# Patient Record
Sex: Female | Born: 1935 | Race: Black or African American | Hispanic: No | Smoking: Never smoker
Health system: Southern US, Community
[De-identification: ages and names within clinical notes are randomized; demographics above are authoritative.]

## PROBLEM LIST (undated history)

## (undated) DIAGNOSIS — I712 Thoracic aortic aneurysm, without rupture, unspecified: Secondary | ICD-10-CM

## (undated) DIAGNOSIS — K259 Gastric ulcer, unspecified as acute or chronic, without hemorrhage or perforation: Secondary | ICD-10-CM

## (undated) DIAGNOSIS — Z86718 Personal history of other venous thrombosis and embolism: Secondary | ICD-10-CM

## (undated) DIAGNOSIS — E785 Hyperlipidemia, unspecified: Secondary | ICD-10-CM

## (undated) DIAGNOSIS — I10 Essential (primary) hypertension: Secondary | ICD-10-CM

## (undated) DIAGNOSIS — I2510 Atherosclerotic heart disease of native coronary artery without angina pectoris: Secondary | ICD-10-CM

## (undated) DIAGNOSIS — K589 Irritable bowel syndrome without diarrhea: Secondary | ICD-10-CM

## (undated) DIAGNOSIS — K5790 Diverticulosis of intestine, part unspecified, without perforation or abscess without bleeding: Secondary | ICD-10-CM

## (undated) DIAGNOSIS — K7460 Unspecified cirrhosis of liver: Secondary | ICD-10-CM

## (undated) HISTORY — DX: Thoracic aortic aneurysm, without rupture: I71.2

## (undated) HISTORY — DX: Atherosclerotic heart disease of native coronary artery without angina pectoris: I25.10

## (undated) HISTORY — DX: Unspecified cirrhosis of liver: K74.60

## (undated) HISTORY — DX: Diverticulosis of intestine, part unspecified, without perforation or abscess without bleeding: K57.90

## (undated) HISTORY — PX: OTHER SURGICAL HISTORY: SHX169

## (undated) HISTORY — DX: Personal history of other venous thrombosis and embolism: Z86.718

## (undated) HISTORY — DX: Irritable bowel syndrome, unspecified: K58.9

## (undated) HISTORY — DX: Thoracic aortic aneurysm, without rupture, unspecified: I71.20

## (undated) HISTORY — DX: Gastric ulcer, unspecified as acute or chronic, without hemorrhage or perforation: K25.9

## (undated) HISTORY — DX: Hyperlipidemia, unspecified: E78.5

## (undated) HISTORY — DX: Essential (primary) hypertension: I10

---

## 1985-03-21 HISTORY — PX: CHOLECYSTECTOMY: SHX55

## 2003-12-23 ENCOUNTER — Other Ambulatory Visit: Payer: Self-pay

## 2003-12-23 ENCOUNTER — Emergency Department: Payer: Self-pay | Admitting: Emergency Medicine

## 2003-12-30 ENCOUNTER — Emergency Department: Payer: Self-pay | Admitting: Emergency Medicine

## 2004-04-03 ENCOUNTER — Inpatient Hospital Stay: Payer: Self-pay | Admitting: Internal Medicine

## 2005-01-02 ENCOUNTER — Emergency Department: Payer: Self-pay | Admitting: Emergency Medicine

## 2005-01-08 ENCOUNTER — Other Ambulatory Visit: Payer: Self-pay

## 2005-01-08 ENCOUNTER — Emergency Department: Payer: Self-pay | Admitting: Emergency Medicine

## 2005-04-20 ENCOUNTER — Emergency Department: Payer: Self-pay | Admitting: Unknown Physician Specialty

## 2005-04-21 ENCOUNTER — Other Ambulatory Visit: Payer: Self-pay

## 2005-05-19 ENCOUNTER — Emergency Department: Payer: Self-pay | Admitting: General Practice

## 2005-05-19 ENCOUNTER — Ambulatory Visit: Payer: Self-pay | Admitting: Gastroenterology

## 2005-05-29 ENCOUNTER — Emergency Department: Payer: Self-pay | Admitting: General Practice

## 2005-05-29 ENCOUNTER — Other Ambulatory Visit: Payer: Self-pay

## 2005-10-15 ENCOUNTER — Emergency Department: Payer: Self-pay

## 2005-10-15 ENCOUNTER — Other Ambulatory Visit: Payer: Self-pay

## 2005-10-17 ENCOUNTER — Ambulatory Visit: Payer: Self-pay

## 2005-11-07 ENCOUNTER — Emergency Department: Payer: Self-pay | Admitting: Unknown Physician Specialty

## 2005-11-08 ENCOUNTER — Other Ambulatory Visit: Payer: Self-pay

## 2005-11-15 ENCOUNTER — Emergency Department: Payer: Self-pay | Admitting: Emergency Medicine

## 2006-04-02 ENCOUNTER — Other Ambulatory Visit: Payer: Self-pay

## 2006-04-02 ENCOUNTER — Emergency Department: Payer: Self-pay | Admitting: Internal Medicine

## 2006-04-28 ENCOUNTER — Ambulatory Visit: Payer: Self-pay | Admitting: Internal Medicine

## 2006-06-03 ENCOUNTER — Emergency Department: Payer: Self-pay | Admitting: Emergency Medicine

## 2006-06-03 ENCOUNTER — Other Ambulatory Visit: Payer: Self-pay

## 2006-06-05 ENCOUNTER — Other Ambulatory Visit: Payer: Self-pay

## 2006-06-05 ENCOUNTER — Emergency Department: Payer: Self-pay | Admitting: Emergency Medicine

## 2006-06-06 ENCOUNTER — Other Ambulatory Visit: Payer: Self-pay

## 2006-06-06 ENCOUNTER — Inpatient Hospital Stay: Payer: Self-pay | Admitting: Internal Medicine

## 2006-06-15 ENCOUNTER — Emergency Department: Payer: Self-pay | Admitting: Emergency Medicine

## 2006-06-17 ENCOUNTER — Other Ambulatory Visit: Payer: Self-pay

## 2006-06-17 ENCOUNTER — Emergency Department: Payer: Self-pay | Admitting: Emergency Medicine

## 2006-06-20 ENCOUNTER — Ambulatory Visit: Payer: Self-pay | Admitting: Gastroenterology

## 2006-06-22 ENCOUNTER — Emergency Department: Payer: Self-pay | Admitting: Internal Medicine

## 2006-07-03 ENCOUNTER — Emergency Department: Payer: Self-pay | Admitting: General Practice

## 2006-07-08 ENCOUNTER — Emergency Department: Payer: Self-pay | Admitting: Emergency Medicine

## 2006-07-08 ENCOUNTER — Other Ambulatory Visit: Payer: Self-pay

## 2006-07-11 ENCOUNTER — Emergency Department: Payer: Self-pay | Admitting: Emergency Medicine

## 2006-07-15 ENCOUNTER — Emergency Department: Payer: Self-pay | Admitting: Emergency Medicine

## 2006-07-27 ENCOUNTER — Emergency Department: Payer: Self-pay | Admitting: Emergency Medicine

## 2006-07-27 ENCOUNTER — Other Ambulatory Visit: Payer: Self-pay

## 2006-11-24 ENCOUNTER — Emergency Department: Payer: Self-pay | Admitting: Emergency Medicine

## 2006-11-24 ENCOUNTER — Other Ambulatory Visit: Payer: Self-pay

## 2006-12-03 ENCOUNTER — Emergency Department: Payer: Self-pay | Admitting: Emergency Medicine

## 2006-12-22 DIAGNOSIS — K5900 Constipation, unspecified: Secondary | ICD-10-CM | POA: Insufficient documentation

## 2006-12-22 DIAGNOSIS — I10 Essential (primary) hypertension: Secondary | ICD-10-CM | POA: Insufficient documentation

## 2007-06-06 ENCOUNTER — Emergency Department: Payer: Self-pay | Admitting: Unknown Physician Specialty

## 2007-06-08 ENCOUNTER — Other Ambulatory Visit: Payer: Self-pay

## 2007-06-08 ENCOUNTER — Emergency Department: Payer: Self-pay | Admitting: Emergency Medicine

## 2007-07-11 ENCOUNTER — Ambulatory Visit: Payer: Self-pay | Admitting: Family Medicine

## 2007-08-14 DIAGNOSIS — I2510 Atherosclerotic heart disease of native coronary artery without angina pectoris: Secondary | ICD-10-CM

## 2007-08-14 HISTORY — DX: Atherosclerotic heart disease of native coronary artery without angina pectoris: I25.10

## 2008-07-31 ENCOUNTER — Ambulatory Visit: Payer: Self-pay | Admitting: Family Medicine

## 2008-08-06 ENCOUNTER — Inpatient Hospital Stay: Payer: Self-pay | Admitting: Internal Medicine

## 2008-08-06 ENCOUNTER — Ambulatory Visit: Payer: Self-pay | Admitting: Family Medicine

## 2008-08-14 DIAGNOSIS — Z86711 Personal history of pulmonary embolism: Secondary | ICD-10-CM | POA: Insufficient documentation

## 2008-08-28 ENCOUNTER — Ambulatory Visit: Payer: Self-pay | Admitting: Family Medicine

## 2008-09-19 ENCOUNTER — Ambulatory Visit: Payer: Self-pay | Admitting: Family Medicine

## 2009-01-09 ENCOUNTER — Emergency Department: Payer: Self-pay | Admitting: Emergency Medicine

## 2009-02-09 ENCOUNTER — Emergency Department: Payer: Self-pay | Admitting: Emergency Medicine

## 2009-02-18 ENCOUNTER — Ambulatory Visit: Payer: Self-pay | Admitting: Oncology

## 2009-02-18 HISTORY — PX: UPPER GASTROINTESTINAL ENDOSCOPY: SHX188

## 2009-02-23 ENCOUNTER — Ambulatory Visit: Payer: Self-pay | Admitting: Family Medicine

## 2009-02-28 ENCOUNTER — Inpatient Hospital Stay: Payer: Self-pay | Admitting: Internal Medicine

## 2009-03-02 DIAGNOSIS — K257 Chronic gastric ulcer without hemorrhage or perforation: Secondary | ICD-10-CM | POA: Insufficient documentation

## 2009-03-06 DIAGNOSIS — K449 Diaphragmatic hernia without obstruction or gangrene: Secondary | ICD-10-CM | POA: Insufficient documentation

## 2009-03-20 ENCOUNTER — Emergency Department: Payer: Self-pay | Admitting: Emergency Medicine

## 2009-03-21 ENCOUNTER — Ambulatory Visit: Payer: Self-pay | Admitting: Oncology

## 2009-04-08 ENCOUNTER — Ambulatory Visit: Payer: Self-pay | Admitting: Oncology

## 2009-04-21 ENCOUNTER — Ambulatory Visit: Payer: Self-pay | Admitting: Oncology

## 2009-05-19 HISTORY — PX: US CAROTID DOPPLER BILATERAL (ARMC HX): HXRAD1402

## 2009-05-19 HISTORY — PX: ESOPHAGOGASTRODUODENOSCOPY: SHX1529

## 2009-06-03 ENCOUNTER — Ambulatory Visit: Payer: Self-pay | Admitting: Family Medicine

## 2009-08-04 ENCOUNTER — Ambulatory Visit: Payer: Self-pay | Admitting: Family Medicine

## 2010-07-19 ENCOUNTER — Ambulatory Visit: Payer: Self-pay | Admitting: Family Medicine

## 2010-07-19 ENCOUNTER — Emergency Department: Payer: Self-pay | Admitting: Emergency Medicine

## 2010-10-21 ENCOUNTER — Ambulatory Visit: Payer: Self-pay | Admitting: Family Medicine

## 2010-10-21 DIAGNOSIS — M545 Low back pain: Secondary | ICD-10-CM | POA: Insufficient documentation

## 2011-04-28 DIAGNOSIS — I2699 Other pulmonary embolism without acute cor pulmonale: Secondary | ICD-10-CM | POA: Diagnosis not present

## 2011-04-28 DIAGNOSIS — E7800 Pure hypercholesterolemia, unspecified: Secondary | ICD-10-CM | POA: Diagnosis not present

## 2011-04-28 DIAGNOSIS — I2510 Atherosclerotic heart disease of native coronary artery without angina pectoris: Secondary | ICD-10-CM | POA: Diagnosis not present

## 2011-04-28 DIAGNOSIS — I10 Essential (primary) hypertension: Secondary | ICD-10-CM | POA: Diagnosis not present

## 2011-05-02 DIAGNOSIS — E7800 Pure hypercholesterolemia, unspecified: Secondary | ICD-10-CM | POA: Diagnosis not present

## 2011-05-03 DIAGNOSIS — R0989 Other specified symptoms and signs involving the circulatory and respiratory systems: Secondary | ICD-10-CM | POA: Diagnosis not present

## 2011-05-03 DIAGNOSIS — R0609 Other forms of dyspnea: Secondary | ICD-10-CM | POA: Diagnosis not present

## 2011-05-03 DIAGNOSIS — I209 Angina pectoris, unspecified: Secondary | ICD-10-CM | POA: Diagnosis not present

## 2011-05-03 DIAGNOSIS — I2510 Atherosclerotic heart disease of native coronary artery without angina pectoris: Secondary | ICD-10-CM | POA: Diagnosis not present

## 2011-05-26 ENCOUNTER — Ambulatory Visit: Payer: Self-pay | Admitting: Internal Medicine

## 2011-05-26 DIAGNOSIS — I209 Angina pectoris, unspecified: Secondary | ICD-10-CM | POA: Diagnosis not present

## 2011-05-26 DIAGNOSIS — I2510 Atherosclerotic heart disease of native coronary artery without angina pectoris: Secondary | ICD-10-CM | POA: Diagnosis not present

## 2011-05-26 DIAGNOSIS — R0602 Shortness of breath: Secondary | ICD-10-CM | POA: Diagnosis not present

## 2011-10-21 DIAGNOSIS — I2510 Atherosclerotic heart disease of native coronary artery without angina pectoris: Secondary | ICD-10-CM | POA: Diagnosis not present

## 2011-10-21 DIAGNOSIS — E7800 Pure hypercholesterolemia, unspecified: Secondary | ICD-10-CM | POA: Diagnosis not present

## 2011-10-21 DIAGNOSIS — Z7901 Long term (current) use of anticoagulants: Secondary | ICD-10-CM | POA: Diagnosis not present

## 2011-10-21 DIAGNOSIS — I2699 Other pulmonary embolism without acute cor pulmonale: Secondary | ICD-10-CM | POA: Diagnosis not present

## 2011-10-28 DIAGNOSIS — E7800 Pure hypercholesterolemia, unspecified: Secondary | ICD-10-CM | POA: Diagnosis not present

## 2011-10-28 DIAGNOSIS — I2699 Other pulmonary embolism without acute cor pulmonale: Secondary | ICD-10-CM | POA: Diagnosis not present

## 2011-10-28 DIAGNOSIS — Z7901 Long term (current) use of anticoagulants: Secondary | ICD-10-CM | POA: Diagnosis not present

## 2011-10-28 DIAGNOSIS — I2510 Atherosclerotic heart disease of native coronary artery without angina pectoris: Secondary | ICD-10-CM | POA: Diagnosis not present

## 2011-11-04 DIAGNOSIS — R109 Unspecified abdominal pain: Secondary | ICD-10-CM | POA: Diagnosis not present

## 2011-11-04 DIAGNOSIS — Z7901 Long term (current) use of anticoagulants: Secondary | ICD-10-CM | POA: Diagnosis not present

## 2011-11-04 DIAGNOSIS — E7800 Pure hypercholesterolemia, unspecified: Secondary | ICD-10-CM | POA: Diagnosis not present

## 2011-11-04 DIAGNOSIS — Z0000 Encounter for general adult medical examination without abnormal findings: Secondary | ICD-10-CM | POA: Diagnosis not present

## 2011-11-10 DIAGNOSIS — I209 Angina pectoris, unspecified: Secondary | ICD-10-CM | POA: Diagnosis not present

## 2011-11-10 DIAGNOSIS — I2510 Atherosclerotic heart disease of native coronary artery without angina pectoris: Secondary | ICD-10-CM | POA: Diagnosis not present

## 2011-12-01 ENCOUNTER — Emergency Department: Payer: Self-pay | Admitting: Internal Medicine

## 2011-12-01 DIAGNOSIS — Z86718 Personal history of other venous thrombosis and embolism: Secondary | ICD-10-CM | POA: Diagnosis not present

## 2011-12-01 DIAGNOSIS — Z79899 Other long term (current) drug therapy: Secondary | ICD-10-CM | POA: Diagnosis not present

## 2011-12-01 DIAGNOSIS — R42 Dizziness and giddiness: Secondary | ICD-10-CM | POA: Diagnosis not present

## 2011-12-01 DIAGNOSIS — I252 Old myocardial infarction: Secondary | ICD-10-CM | POA: Diagnosis not present

## 2011-12-01 DIAGNOSIS — Z23 Encounter for immunization: Secondary | ICD-10-CM | POA: Diagnosis not present

## 2011-12-01 DIAGNOSIS — D689 Coagulation defect, unspecified: Secondary | ICD-10-CM | POA: Diagnosis not present

## 2011-12-01 DIAGNOSIS — I2510 Atherosclerotic heart disease of native coronary artery without angina pectoris: Secondary | ICD-10-CM | POA: Diagnosis not present

## 2011-12-01 DIAGNOSIS — Z7901 Long term (current) use of anticoagulants: Secondary | ICD-10-CM | POA: Diagnosis not present

## 2011-12-01 DIAGNOSIS — N289 Disorder of kidney and ureter, unspecified: Secondary | ICD-10-CM | POA: Diagnosis not present

## 2011-12-01 DIAGNOSIS — I10 Essential (primary) hypertension: Secondary | ICD-10-CM | POA: Diagnosis not present

## 2011-12-01 LAB — COMPREHENSIVE METABOLIC PANEL
BUN: 19 mg/dL — ABNORMAL HIGH (ref 7–18)
Bilirubin,Total: 1 mg/dL (ref 0.2–1.0)
Calcium, Total: 9.5 mg/dL (ref 8.5–10.1)
Chloride: 112 mmol/L — ABNORMAL HIGH (ref 98–107)
Co2: 19 mmol/L — ABNORMAL LOW (ref 21–32)
Creatinine: 1.05 mg/dL (ref 0.60–1.30)
EGFR (African American): 59 — ABNORMAL LOW
EGFR (Non-African Amer.): 51 — ABNORMAL LOW
Osmolality: 278 (ref 275–301)
Potassium: 4.1 mmol/L (ref 3.5–5.1)
SGOT(AST): 51 U/L — ABNORMAL HIGH (ref 15–37)
SGPT (ALT): 38 U/L (ref 12–78)
Sodium: 138 mmol/L (ref 136–145)

## 2011-12-01 LAB — CBC
HCT: 42.3 % (ref 35.0–47.0)
HGB: 14.3 g/dL (ref 12.0–16.0)
RDW: 14.6 % — ABNORMAL HIGH (ref 11.5–14.5)
WBC: 6.2 10*3/uL (ref 3.6–11.0)

## 2011-12-01 LAB — PROTIME-INR
INR: 4.4
Prothrombin Time: 42 secs — ABNORMAL HIGH (ref 11.5–14.7)

## 2011-12-02 DIAGNOSIS — Z0000 Encounter for general adult medical examination without abnormal findings: Secondary | ICD-10-CM | POA: Diagnosis not present

## 2011-12-02 DIAGNOSIS — Z86718 Personal history of other venous thrombosis and embolism: Secondary | ICD-10-CM | POA: Diagnosis not present

## 2011-12-02 DIAGNOSIS — I2699 Other pulmonary embolism without acute cor pulmonale: Secondary | ICD-10-CM | POA: Diagnosis not present

## 2011-12-02 DIAGNOSIS — K257 Chronic gastric ulcer without hemorrhage or perforation: Secondary | ICD-10-CM | POA: Diagnosis not present

## 2011-12-02 DIAGNOSIS — R1013 Epigastric pain: Secondary | ICD-10-CM | POA: Diagnosis not present

## 2011-12-05 DIAGNOSIS — R1013 Epigastric pain: Secondary | ICD-10-CM | POA: Diagnosis not present

## 2011-12-05 DIAGNOSIS — Z0000 Encounter for general adult medical examination without abnormal findings: Secondary | ICD-10-CM | POA: Diagnosis not present

## 2011-12-05 DIAGNOSIS — Z23 Encounter for immunization: Secondary | ICD-10-CM | POA: Diagnosis not present

## 2011-12-05 DIAGNOSIS — K589 Irritable bowel syndrome without diarrhea: Secondary | ICD-10-CM | POA: Diagnosis not present

## 2011-12-05 DIAGNOSIS — Z86718 Personal history of other venous thrombosis and embolism: Secondary | ICD-10-CM | POA: Diagnosis not present

## 2011-12-05 DIAGNOSIS — Z7901 Long term (current) use of anticoagulants: Secondary | ICD-10-CM | POA: Diagnosis not present

## 2012-01-05 DIAGNOSIS — Z86718 Personal history of other venous thrombosis and embolism: Secondary | ICD-10-CM | POA: Diagnosis not present

## 2012-01-05 DIAGNOSIS — Z23 Encounter for immunization: Secondary | ICD-10-CM | POA: Diagnosis not present

## 2012-01-05 DIAGNOSIS — R42 Dizziness and giddiness: Secondary | ICD-10-CM | POA: Diagnosis not present

## 2012-01-05 DIAGNOSIS — I10 Essential (primary) hypertension: Secondary | ICD-10-CM | POA: Diagnosis not present

## 2012-01-19 DIAGNOSIS — R42 Dizziness and giddiness: Secondary | ICD-10-CM | POA: Diagnosis not present

## 2012-01-19 DIAGNOSIS — Z23 Encounter for immunization: Secondary | ICD-10-CM | POA: Diagnosis not present

## 2012-01-19 DIAGNOSIS — Z86718 Personal history of other venous thrombosis and embolism: Secondary | ICD-10-CM | POA: Diagnosis not present

## 2012-01-19 DIAGNOSIS — I10 Essential (primary) hypertension: Secondary | ICD-10-CM | POA: Diagnosis not present

## 2012-03-15 DIAGNOSIS — H4010X0 Unspecified open-angle glaucoma, stage unspecified: Secondary | ICD-10-CM | POA: Diagnosis not present

## 2012-04-27 DIAGNOSIS — M129 Arthropathy, unspecified: Secondary | ICD-10-CM | POA: Diagnosis not present

## 2012-04-27 DIAGNOSIS — I10 Essential (primary) hypertension: Secondary | ICD-10-CM | POA: Diagnosis not present

## 2012-04-27 DIAGNOSIS — E7800 Pure hypercholesterolemia, unspecified: Secondary | ICD-10-CM | POA: Diagnosis not present

## 2012-04-27 DIAGNOSIS — I2510 Atherosclerotic heart disease of native coronary artery without angina pectoris: Secondary | ICD-10-CM | POA: Diagnosis not present

## 2012-05-14 DIAGNOSIS — I2510 Atherosclerotic heart disease of native coronary artery without angina pectoris: Secondary | ICD-10-CM | POA: Diagnosis not present

## 2012-05-14 DIAGNOSIS — I10 Essential (primary) hypertension: Secondary | ICD-10-CM | POA: Diagnosis not present

## 2012-07-21 ENCOUNTER — Emergency Department: Payer: Self-pay | Admitting: Emergency Medicine

## 2012-07-21 DIAGNOSIS — I10 Essential (primary) hypertension: Secondary | ICD-10-CM | POA: Diagnosis not present

## 2012-07-21 DIAGNOSIS — R6889 Other general symptoms and signs: Secondary | ICD-10-CM | POA: Diagnosis not present

## 2012-07-21 DIAGNOSIS — E119 Type 2 diabetes mellitus without complications: Secondary | ICD-10-CM | POA: Diagnosis not present

## 2012-07-21 DIAGNOSIS — R945 Abnormal results of liver function studies: Secondary | ICD-10-CM | POA: Diagnosis not present

## 2012-07-21 DIAGNOSIS — R1110 Vomiting, unspecified: Secondary | ICD-10-CM | POA: Diagnosis not present

## 2012-07-21 DIAGNOSIS — Z9089 Acquired absence of other organs: Secondary | ICD-10-CM | POA: Diagnosis not present

## 2012-07-21 DIAGNOSIS — R9431 Abnormal electrocardiogram [ECG] [EKG]: Secondary | ICD-10-CM | POA: Diagnosis not present

## 2012-07-21 DIAGNOSIS — R112 Nausea with vomiting, unspecified: Secondary | ICD-10-CM | POA: Diagnosis not present

## 2012-07-21 DIAGNOSIS — I252 Old myocardial infarction: Secondary | ICD-10-CM | POA: Diagnosis not present

## 2012-07-21 DIAGNOSIS — N390 Urinary tract infection, site not specified: Secondary | ICD-10-CM | POA: Diagnosis not present

## 2012-07-21 DIAGNOSIS — R197 Diarrhea, unspecified: Secondary | ICD-10-CM | POA: Diagnosis not present

## 2012-07-21 LAB — CBC
HCT: 45.5 % (ref 35.0–47.0)
MCH: 32.2 pg (ref 26.0–34.0)
MCV: 95 fL (ref 80–100)
Platelet: 149 10*3/uL — ABNORMAL LOW (ref 150–440)
RBC: 4.8 10*6/uL (ref 3.80–5.20)
RDW: 14.1 % (ref 11.5–14.5)

## 2012-07-21 LAB — COMPREHENSIVE METABOLIC PANEL
Albumin: 3.3 g/dL — ABNORMAL LOW (ref 3.4–5.0)
BUN: 17 mg/dL (ref 7–18)
Co2: 21 mmol/L (ref 21–32)
EGFR (African American): >60
EGFR (Non-African Amer.): 57 — ABNORMAL LOW
Glucose: 111 mg/dL — ABNORMAL HIGH (ref 65–99)
SGOT(AST): 142 U/L — ABNORMAL HIGH (ref 15–37)

## 2012-07-22 LAB — URINALYSIS, COMPLETE
Bilirubin,UR: NEGATIVE
Glucose,UR: NEGATIVE mg/dL (ref 0–75)
Nitrite: NEGATIVE
Ph: 5 (ref 4.5–8.0)
RBC,UR: 10 /HPF (ref 0–5)
Squamous Epithelial: 14
WBC UR: 43 /HPF (ref 0–5)

## 2012-10-09 DIAGNOSIS — H4010X0 Unspecified open-angle glaucoma, stage unspecified: Secondary | ICD-10-CM | POA: Diagnosis not present

## 2012-11-23 DIAGNOSIS — E7800 Pure hypercholesterolemia, unspecified: Secondary | ICD-10-CM | POA: Diagnosis not present

## 2012-11-23 DIAGNOSIS — M25519 Pain in unspecified shoulder: Secondary | ICD-10-CM | POA: Diagnosis not present

## 2012-11-23 DIAGNOSIS — I10 Essential (primary) hypertension: Secondary | ICD-10-CM | POA: Diagnosis not present

## 2012-11-23 DIAGNOSIS — I2510 Atherosclerotic heart disease of native coronary artery without angina pectoris: Secondary | ICD-10-CM | POA: Diagnosis not present

## 2012-11-23 DIAGNOSIS — M549 Dorsalgia, unspecified: Secondary | ICD-10-CM | POA: Diagnosis not present

## 2012-11-23 DIAGNOSIS — Z23 Encounter for immunization: Secondary | ICD-10-CM | POA: Diagnosis not present

## 2012-11-23 LAB — LIPID PANEL
CHOLESTEROL: 120 mg/dL (ref 0–200)
HDL: 48 mg/dL (ref 35–70)
LDL CALC: 59 mg/dL
TRIGLYCERIDES: 63 mg/dL (ref 40–160)

## 2012-11-23 LAB — BASIC METABOLIC PANEL
BUN: 12 mg/dL (ref 4–21)
CREATININE: 1 mg/dL (ref 0.5–1.1)
Glucose: 88 mg/dL
Potassium: 4.3 mmol/L (ref 3.4–5.3)
Sodium: 140 mmol/L (ref 137–147)

## 2012-11-26 ENCOUNTER — Ambulatory Visit: Payer: Self-pay | Admitting: Family Medicine

## 2012-11-26 DIAGNOSIS — M47814 Spondylosis without myelopathy or radiculopathy, thoracic region: Secondary | ICD-10-CM | POA: Diagnosis not present

## 2012-11-26 DIAGNOSIS — M25519 Pain in unspecified shoulder: Secondary | ICD-10-CM | POA: Diagnosis not present

## 2012-11-26 DIAGNOSIS — IMO0002 Reserved for concepts with insufficient information to code with codable children: Secondary | ICD-10-CM | POA: Diagnosis not present

## 2012-11-26 DIAGNOSIS — M549 Dorsalgia, unspecified: Secondary | ICD-10-CM | POA: Diagnosis not present

## 2012-12-04 DIAGNOSIS — R011 Cardiac murmur, unspecified: Secondary | ICD-10-CM | POA: Diagnosis not present

## 2012-12-04 DIAGNOSIS — I209 Angina pectoris, unspecified: Secondary | ICD-10-CM | POA: Diagnosis not present

## 2012-12-04 DIAGNOSIS — I2510 Atherosclerotic heart disease of native coronary artery without angina pectoris: Secondary | ICD-10-CM | POA: Diagnosis not present

## 2012-12-04 DIAGNOSIS — I10 Essential (primary) hypertension: Secondary | ICD-10-CM | POA: Diagnosis not present

## 2012-12-19 DIAGNOSIS — I10 Essential (primary) hypertension: Secondary | ICD-10-CM | POA: Diagnosis not present

## 2012-12-19 DIAGNOSIS — I209 Angina pectoris, unspecified: Secondary | ICD-10-CM | POA: Diagnosis not present

## 2012-12-19 DIAGNOSIS — I2510 Atherosclerotic heart disease of native coronary artery without angina pectoris: Secondary | ICD-10-CM | POA: Diagnosis not present

## 2012-12-19 DIAGNOSIS — I252 Old myocardial infarction: Secondary | ICD-10-CM | POA: Diagnosis not present

## 2013-05-23 DIAGNOSIS — H4010X0 Unspecified open-angle glaucoma, stage unspecified: Secondary | ICD-10-CM | POA: Diagnosis not present

## 2013-06-25 DIAGNOSIS — I209 Angina pectoris, unspecified: Secondary | ICD-10-CM | POA: Diagnosis not present

## 2013-06-25 DIAGNOSIS — I10 Essential (primary) hypertension: Secondary | ICD-10-CM | POA: Diagnosis not present

## 2013-06-25 DIAGNOSIS — I2510 Atherosclerotic heart disease of native coronary artery without angina pectoris: Secondary | ICD-10-CM | POA: Diagnosis not present

## 2013-07-08 DIAGNOSIS — K5900 Constipation, unspecified: Secondary | ICD-10-CM | POA: Diagnosis not present

## 2013-07-08 DIAGNOSIS — R1084 Generalized abdominal pain: Secondary | ICD-10-CM | POA: Diagnosis not present

## 2013-07-16 ENCOUNTER — Ambulatory Visit: Payer: Self-pay | Admitting: Gastroenterology

## 2013-07-16 DIAGNOSIS — K7460 Unspecified cirrhosis of liver: Secondary | ICD-10-CM | POA: Diagnosis not present

## 2013-07-16 DIAGNOSIS — I712 Thoracic aortic aneurysm, without rupture, unspecified: Secondary | ICD-10-CM | POA: Diagnosis not present

## 2013-07-16 DIAGNOSIS — K5900 Constipation, unspecified: Secondary | ICD-10-CM | POA: Diagnosis not present

## 2013-07-16 DIAGNOSIS — R1084 Generalized abdominal pain: Secondary | ICD-10-CM | POA: Diagnosis not present

## 2013-07-19 DIAGNOSIS — I10 Essential (primary) hypertension: Secondary | ICD-10-CM | POA: Diagnosis not present

## 2013-07-19 DIAGNOSIS — I2510 Atherosclerotic heart disease of native coronary artery without angina pectoris: Secondary | ICD-10-CM | POA: Diagnosis not present

## 2013-07-19 DIAGNOSIS — I712 Thoracic aortic aneurysm, without rupture, unspecified: Secondary | ICD-10-CM | POA: Diagnosis not present

## 2013-07-19 DIAGNOSIS — E785 Hyperlipidemia, unspecified: Secondary | ICD-10-CM | POA: Diagnosis not present

## 2013-07-22 DIAGNOSIS — I868 Varicose veins of other specified sites: Secondary | ICD-10-CM | POA: Diagnosis not present

## 2013-07-22 DIAGNOSIS — I712 Thoracic aortic aneurysm, without rupture, unspecified: Secondary | ICD-10-CM | POA: Diagnosis not present

## 2013-07-22 DIAGNOSIS — R918 Other nonspecific abnormal finding of lung field: Secondary | ICD-10-CM | POA: Diagnosis not present

## 2013-07-22 DIAGNOSIS — I2510 Atherosclerotic heart disease of native coronary artery without angina pectoris: Secondary | ICD-10-CM | POA: Diagnosis not present

## 2013-07-22 DIAGNOSIS — E079 Disorder of thyroid, unspecified: Secondary | ICD-10-CM | POA: Diagnosis not present

## 2013-07-22 DIAGNOSIS — N289 Disorder of kidney and ureter, unspecified: Secondary | ICD-10-CM | POA: Diagnosis not present

## 2013-07-22 DIAGNOSIS — N281 Cyst of kidney, acquired: Secondary | ICD-10-CM | POA: Diagnosis not present

## 2013-07-22 DIAGNOSIS — K7689 Other specified diseases of liver: Secondary | ICD-10-CM | POA: Diagnosis not present

## 2013-07-29 ENCOUNTER — Ambulatory Visit: Payer: Self-pay | Admitting: Gastroenterology

## 2013-07-29 DIAGNOSIS — I712 Thoracic aortic aneurysm, without rupture, unspecified: Secondary | ICD-10-CM | POA: Diagnosis not present

## 2013-07-29 DIAGNOSIS — N289 Disorder of kidney and ureter, unspecified: Secondary | ICD-10-CM | POA: Diagnosis not present

## 2013-07-29 DIAGNOSIS — K869 Disease of pancreas, unspecified: Secondary | ICD-10-CM | POA: Diagnosis not present

## 2013-07-29 DIAGNOSIS — K7460 Unspecified cirrhosis of liver: Secondary | ICD-10-CM | POA: Diagnosis not present

## 2013-08-01 ENCOUNTER — Ambulatory Visit: Payer: Self-pay | Admitting: Oncology

## 2013-08-05 DIAGNOSIS — R339 Retention of urine, unspecified: Secondary | ICD-10-CM | POA: Diagnosis not present

## 2013-08-05 DIAGNOSIS — D4120 Neoplasm of uncertain behavior of unspecified ureter: Secondary | ICD-10-CM | POA: Diagnosis not present

## 2013-08-05 DIAGNOSIS — D4100 Neoplasm of uncertain behavior of unspecified kidney: Secondary | ICD-10-CM | POA: Diagnosis not present

## 2013-08-05 DIAGNOSIS — N281 Cyst of kidney, acquired: Secondary | ICD-10-CM | POA: Diagnosis not present

## 2013-08-06 ENCOUNTER — Ambulatory Visit: Payer: Self-pay | Admitting: Oncology

## 2013-08-06 DIAGNOSIS — N289 Disorder of kidney and ureter, unspecified: Secondary | ICD-10-CM | POA: Diagnosis not present

## 2013-08-06 DIAGNOSIS — I712 Thoracic aortic aneurysm, without rupture, unspecified: Secondary | ICD-10-CM | POA: Diagnosis not present

## 2013-08-06 DIAGNOSIS — I10 Essential (primary) hypertension: Secondary | ICD-10-CM | POA: Diagnosis not present

## 2013-08-06 DIAGNOSIS — K7460 Unspecified cirrhosis of liver: Secondary | ICD-10-CM | POA: Diagnosis not present

## 2013-08-06 DIAGNOSIS — R109 Unspecified abdominal pain: Secondary | ICD-10-CM | POA: Diagnosis not present

## 2013-08-06 DIAGNOSIS — R978 Other abnormal tumor markers: Secondary | ICD-10-CM | POA: Diagnosis not present

## 2013-08-06 DIAGNOSIS — K869 Disease of pancreas, unspecified: Secondary | ICD-10-CM | POA: Diagnosis not present

## 2013-08-06 DIAGNOSIS — E7800 Pure hypercholesterolemia, unspecified: Secondary | ICD-10-CM | POA: Diagnosis not present

## 2013-08-06 LAB — COMPREHENSIVE METABOLIC PANEL
ALT: 21 U/L (ref 12–78)
Albumin: 3.1 g/dL — ABNORMAL LOW (ref 3.4–5.0)
Alkaline Phosphatase: 118 U/L — ABNORMAL HIGH
Anion Gap: 9 (ref 7–16)
BUN: 15 mg/dL (ref 7–18)
Bilirubin,Total: 1.5 mg/dL — ABNORMAL HIGH (ref 0.2–1.0)
Calcium, Total: 9 mg/dL (ref 8.5–10.1)
Chloride: 109 mmol/L — ABNORMAL HIGH (ref 98–107)
Co2: 23 mmol/L (ref 21–32)
Creatinine: 1.06 mg/dL (ref 0.60–1.30)
EGFR (African American): 58 — ABNORMAL LOW
GFR CALC NON AF AMER: 50 — AB
Glucose: 111 mg/dL — ABNORMAL HIGH (ref 65–99)
OSMOLALITY: 283 (ref 275–301)
POTASSIUM: 3.8 mmol/L (ref 3.5–5.1)
SGOT(AST): 36 U/L (ref 15–37)
SODIUM: 141 mmol/L (ref 136–145)
TOTAL PROTEIN: 7.6 g/dL (ref 6.4–8.2)

## 2013-08-06 LAB — CBC CANCER CENTER
BASOS ABS: 0.1 x10 3/mm (ref 0.0–0.1)
Basophil %: 1 %
Eosinophil #: 0.1 x10 3/mm (ref 0.0–0.7)
Eosinophil %: 2.2 %
HCT: 40.7 % (ref 35.0–47.0)
HGB: 13.6 g/dL (ref 12.0–16.0)
LYMPHS ABS: 1.8 x10 3/mm (ref 1.0–3.6)
Lymphocyte %: 30.7 %
MCH: 32.2 pg (ref 26.0–34.0)
MCHC: 33.3 g/dL (ref 32.0–36.0)
MCV: 97 fL (ref 80–100)
MONO ABS: 0.6 x10 3/mm (ref 0.2–0.9)
MONOS PCT: 10.7 %
Neutrophil #: 3.3 x10 3/mm (ref 1.4–6.5)
Neutrophil %: 55.4 %
Platelet: 110 x10 3/mm — ABNORMAL LOW (ref 150–440)
RBC: 4.21 10*6/uL (ref 3.80–5.20)
RDW: 13.7 % (ref 11.5–14.5)
WBC: 5.9 x10 3/mm (ref 3.6–11.0)

## 2013-08-07 ENCOUNTER — Ambulatory Visit: Payer: Self-pay | Admitting: Vascular Surgery

## 2013-08-07 DIAGNOSIS — R339 Retention of urine, unspecified: Secondary | ICD-10-CM | POA: Insufficient documentation

## 2013-08-07 DIAGNOSIS — I2510 Atherosclerotic heart disease of native coronary artery without angina pectoris: Secondary | ICD-10-CM | POA: Diagnosis not present

## 2013-08-07 DIAGNOSIS — I10 Essential (primary) hypertension: Secondary | ICD-10-CM | POA: Diagnosis not present

## 2013-08-07 DIAGNOSIS — E785 Hyperlipidemia, unspecified: Secondary | ICD-10-CM | POA: Diagnosis not present

## 2013-08-07 DIAGNOSIS — Z01812 Encounter for preprocedural laboratory examination: Secondary | ICD-10-CM | POA: Diagnosis not present

## 2013-08-07 DIAGNOSIS — N281 Cyst of kidney, acquired: Secondary | ICD-10-CM | POA: Insufficient documentation

## 2013-08-07 DIAGNOSIS — Z01810 Encounter for preprocedural cardiovascular examination: Secondary | ICD-10-CM | POA: Diagnosis not present

## 2013-08-07 DIAGNOSIS — D4120 Neoplasm of uncertain behavior of unspecified ureter: Secondary | ICD-10-CM | POA: Insufficient documentation

## 2013-08-07 DIAGNOSIS — I712 Thoracic aortic aneurysm, without rupture, unspecified: Secondary | ICD-10-CM | POA: Diagnosis not present

## 2013-08-07 LAB — URINALYSIS, COMPLETE
BILIRUBIN, UR: NEGATIVE
Glucose,UR: NEGATIVE mg/dL (ref 0–75)
Hyaline Cast: 2
KETONE: NEGATIVE
NITRITE: NEGATIVE
Ph: 5 (ref 4.5–8.0)
Protein: NEGATIVE
RBC,UR: 24 /HPF (ref 0–5)
Specific Gravity: 1.019 (ref 1.003–1.030)
Squamous Epithelial: 9
WBC UR: 5 /HPF (ref 0–5)

## 2013-08-07 LAB — CANCER ANTIGEN 19-9: CA 19 9: 81 U/mL — AB (ref 0–35)

## 2013-08-08 DIAGNOSIS — Z86718 Personal history of other venous thrombosis and embolism: Secondary | ICD-10-CM | POA: Diagnosis not present

## 2013-08-08 DIAGNOSIS — Z86711 Personal history of pulmonary embolism: Secondary | ICD-10-CM | POA: Diagnosis not present

## 2013-08-08 DIAGNOSIS — I7100 Dissection of unspecified site of aorta: Secondary | ICD-10-CM | POA: Diagnosis not present

## 2013-08-08 DIAGNOSIS — I10 Essential (primary) hypertension: Secondary | ICD-10-CM | POA: Diagnosis not present

## 2013-08-13 DIAGNOSIS — I10 Essential (primary) hypertension: Secondary | ICD-10-CM | POA: Diagnosis not present

## 2013-08-13 DIAGNOSIS — Z86718 Personal history of other venous thrombosis and embolism: Secondary | ICD-10-CM | POA: Diagnosis not present

## 2013-08-13 DIAGNOSIS — I7100 Dissection of unspecified site of aorta: Secondary | ICD-10-CM | POA: Diagnosis not present

## 2013-08-13 DIAGNOSIS — Z86711 Personal history of pulmonary embolism: Secondary | ICD-10-CM | POA: Diagnosis not present

## 2013-08-14 ENCOUNTER — Inpatient Hospital Stay: Payer: Self-pay | Admitting: Vascular Surgery

## 2013-08-14 DIAGNOSIS — I712 Thoracic aortic aneurysm, without rupture, unspecified: Secondary | ICD-10-CM | POA: Diagnosis not present

## 2013-08-14 DIAGNOSIS — I10 Essential (primary) hypertension: Secondary | ICD-10-CM | POA: Diagnosis not present

## 2013-08-14 DIAGNOSIS — N289 Disorder of kidney and ureter, unspecified: Secondary | ICD-10-CM | POA: Diagnosis present

## 2013-08-14 DIAGNOSIS — E785 Hyperlipidemia, unspecified: Secondary | ICD-10-CM | POA: Diagnosis present

## 2013-08-14 DIAGNOSIS — I711 Thoracic aortic aneurysm, ruptured, unspecified: Secondary | ICD-10-CM | POA: Diagnosis not present

## 2013-08-14 DIAGNOSIS — I2510 Atherosclerotic heart disease of native coronary artery without angina pectoris: Secondary | ICD-10-CM | POA: Diagnosis present

## 2013-08-15 LAB — COMPREHENSIVE METABOLIC PANEL
AST: 41 U/L — AB (ref 15–37)
Albumin: 2.6 g/dL — ABNORMAL LOW (ref 3.4–5.0)
Alkaline Phosphatase: 100 U/L
Anion Gap: 5 — ABNORMAL LOW (ref 7–16)
BUN: 14 mg/dL (ref 7–18)
Bilirubin,Total: 0.9 mg/dL (ref 0.2–1.0)
CREATININE: 1.09 mg/dL (ref 0.60–1.30)
Calcium, Total: 8.3 mg/dL — ABNORMAL LOW (ref 8.5–10.1)
Chloride: 114 mmol/L — ABNORMAL HIGH (ref 98–107)
Co2: 22 mmol/L (ref 21–32)
EGFR (African American): 56 — ABNORMAL LOW
GFR CALC NON AF AMER: 48 — AB
Glucose: 134 mg/dL — ABNORMAL HIGH (ref 65–99)
Osmolality: 284 (ref 275–301)
Potassium: 3.9 mmol/L (ref 3.5–5.1)
SGPT (ALT): 25 U/L (ref 12–78)
Sodium: 141 mmol/L (ref 136–145)
Total Protein: 6.7 g/dL (ref 6.4–8.2)

## 2013-08-15 LAB — CBC WITH DIFFERENTIAL/PLATELET
BASOS PCT: 0.3 %
Basophil #: 0 10*3/uL (ref 0.0–0.1)
EOS ABS: 0 10*3/uL (ref 0.0–0.7)
EOS PCT: 0 %
HCT: 38.7 % (ref 35.0–47.0)
HGB: 12.6 g/dL (ref 12.0–16.0)
LYMPHS PCT: 8.2 %
Lymphocyte #: 0.8 10*3/uL — ABNORMAL LOW (ref 1.0–3.6)
MCH: 32 pg (ref 26.0–34.0)
MCHC: 32.7 g/dL (ref 32.0–36.0)
MCV: 98 fL (ref 80–100)
MONO ABS: 0.6 x10 3/mm (ref 0.2–0.9)
Monocyte %: 6.1 %
NEUTROS ABS: 8.1 10*3/uL — AB (ref 1.4–6.5)
Neutrophil %: 85.4 %
Platelet: 66 10*3/uL — ABNORMAL LOW (ref 150–440)
RBC: 3.94 10*6/uL (ref 3.80–5.20)
RDW: 13.5 % (ref 11.5–14.5)
WBC: 9.5 10*3/uL (ref 3.6–11.0)

## 2013-08-15 LAB — PROTIME-INR
INR: 1.4
Prothrombin Time: 17.2 secs — ABNORMAL HIGH (ref 11.5–14.7)

## 2013-08-15 LAB — APTT: Activated PTT: 35.1 secs (ref 23.6–35.9)

## 2013-08-15 LAB — MAGNESIUM: MAGNESIUM: 1.6 mg/dL — AB

## 2013-08-15 LAB — PHOSPHORUS: PHOSPHORUS: 3 mg/dL (ref 2.5–4.9)

## 2013-08-19 ENCOUNTER — Ambulatory Visit: Payer: Self-pay | Admitting: Oncology

## 2013-09-06 DIAGNOSIS — I10 Essential (primary) hypertension: Secondary | ICD-10-CM | POA: Diagnosis not present

## 2013-09-06 DIAGNOSIS — R109 Unspecified abdominal pain: Secondary | ICD-10-CM | POA: Diagnosis not present

## 2013-09-06 DIAGNOSIS — I712 Thoracic aortic aneurysm, without rupture, unspecified: Secondary | ICD-10-CM | POA: Diagnosis not present

## 2013-09-16 ENCOUNTER — Ambulatory Visit: Payer: Self-pay | Admitting: Family Medicine

## 2013-09-16 DIAGNOSIS — I7100 Dissection of unspecified site of aorta: Secondary | ICD-10-CM | POA: Diagnosis not present

## 2013-09-16 DIAGNOSIS — K589 Irritable bowel syndrome without diarrhea: Secondary | ICD-10-CM | POA: Diagnosis not present

## 2013-09-16 DIAGNOSIS — K5900 Constipation, unspecified: Secondary | ICD-10-CM | POA: Diagnosis not present

## 2013-09-16 DIAGNOSIS — R109 Unspecified abdominal pain: Secondary | ICD-10-CM | POA: Diagnosis not present

## 2013-09-16 DIAGNOSIS — R1013 Epigastric pain: Secondary | ICD-10-CM | POA: Diagnosis not present

## 2013-09-24 ENCOUNTER — Ambulatory Visit: Payer: Self-pay | Admitting: Oncology

## 2013-10-10 ENCOUNTER — Ambulatory Visit: Payer: Self-pay | Admitting: Gastroenterology

## 2013-10-10 DIAGNOSIS — K869 Disease of pancreas, unspecified: Secondary | ICD-10-CM | POA: Diagnosis not present

## 2013-10-10 DIAGNOSIS — I10 Essential (primary) hypertension: Secondary | ICD-10-CM | POA: Diagnosis not present

## 2013-10-10 DIAGNOSIS — I712 Thoracic aortic aneurysm, without rupture, unspecified: Secondary | ICD-10-CM | POA: Diagnosis not present

## 2013-10-10 DIAGNOSIS — Z79899 Other long term (current) drug therapy: Secondary | ICD-10-CM | POA: Diagnosis not present

## 2013-10-10 DIAGNOSIS — K862 Cyst of pancreas: Secondary | ICD-10-CM | POA: Diagnosis not present

## 2013-10-10 DIAGNOSIS — R109 Unspecified abdominal pain: Secondary | ICD-10-CM | POA: Diagnosis not present

## 2013-10-10 DIAGNOSIS — I252 Old myocardial infarction: Secondary | ICD-10-CM | POA: Diagnosis not present

## 2013-10-10 DIAGNOSIS — M1990 Unspecified osteoarthritis, unspecified site: Secondary | ICD-10-CM | POA: Diagnosis not present

## 2013-10-10 DIAGNOSIS — N289 Disorder of kidney and ureter, unspecified: Secondary | ICD-10-CM | POA: Diagnosis not present

## 2013-10-10 DIAGNOSIS — R1013 Epigastric pain: Secondary | ICD-10-CM | POA: Diagnosis not present

## 2013-10-10 DIAGNOSIS — H409 Unspecified glaucoma: Secondary | ICD-10-CM | POA: Diagnosis not present

## 2013-10-16 DIAGNOSIS — K863 Pseudocyst of pancreas: Secondary | ICD-10-CM | POA: Diagnosis not present

## 2013-10-16 DIAGNOSIS — K862 Cyst of pancreas: Secondary | ICD-10-CM | POA: Diagnosis not present

## 2013-10-19 ENCOUNTER — Ambulatory Visit: Payer: Self-pay | Admitting: Oncology

## 2013-10-24 LAB — PATHOLOGY REPORT

## 2013-11-06 DIAGNOSIS — R634 Abnormal weight loss: Secondary | ICD-10-CM | POA: Diagnosis not present

## 2013-11-06 DIAGNOSIS — R748 Abnormal levels of other serum enzymes: Secondary | ICD-10-CM | POA: Diagnosis not present

## 2013-11-06 DIAGNOSIS — R933 Abnormal findings on diagnostic imaging of other parts of digestive tract: Secondary | ICD-10-CM | POA: Diagnosis not present

## 2013-11-06 DIAGNOSIS — R109 Unspecified abdominal pain: Secondary | ICD-10-CM | POA: Diagnosis not present

## 2013-11-07 ENCOUNTER — Ambulatory Visit: Payer: Self-pay | Admitting: Gastroenterology

## 2013-11-07 DIAGNOSIS — R634 Abnormal weight loss: Secondary | ICD-10-CM | POA: Diagnosis not present

## 2013-11-07 DIAGNOSIS — N281 Cyst of kidney, acquired: Secondary | ICD-10-CM | POA: Diagnosis not present

## 2013-11-07 DIAGNOSIS — K7460 Unspecified cirrhosis of liver: Secondary | ICD-10-CM | POA: Diagnosis not present

## 2013-11-07 DIAGNOSIS — R933 Abnormal findings on diagnostic imaging of other parts of digestive tract: Secondary | ICD-10-CM | POA: Diagnosis not present

## 2013-11-07 DIAGNOSIS — R109 Unspecified abdominal pain: Secondary | ICD-10-CM | POA: Diagnosis not present

## 2013-11-13 DIAGNOSIS — K7460 Unspecified cirrhosis of liver: Secondary | ICD-10-CM | POA: Diagnosis not present

## 2013-11-13 DIAGNOSIS — R634 Abnormal weight loss: Secondary | ICD-10-CM | POA: Diagnosis not present

## 2013-11-13 DIAGNOSIS — R748 Abnormal levels of other serum enzymes: Secondary | ICD-10-CM | POA: Diagnosis not present

## 2013-11-13 DIAGNOSIS — R4182 Altered mental status, unspecified: Secondary | ICD-10-CM | POA: Diagnosis not present

## 2013-11-13 DIAGNOSIS — R1084 Generalized abdominal pain: Secondary | ICD-10-CM | POA: Diagnosis not present

## 2013-11-13 DIAGNOSIS — R933 Abnormal findings on diagnostic imaging of other parts of digestive tract: Secondary | ICD-10-CM | POA: Diagnosis not present

## 2013-12-04 ENCOUNTER — Emergency Department: Payer: Self-pay | Admitting: Emergency Medicine

## 2013-12-04 DIAGNOSIS — Z7901 Long term (current) use of anticoagulants: Secondary | ICD-10-CM | POA: Diagnosis not present

## 2013-12-04 DIAGNOSIS — R52 Pain, unspecified: Secondary | ICD-10-CM | POA: Diagnosis not present

## 2013-12-04 DIAGNOSIS — K5641 Fecal impaction: Secondary | ICD-10-CM | POA: Diagnosis not present

## 2013-12-04 DIAGNOSIS — I82509 Chronic embolism and thrombosis of unspecified deep veins of unspecified lower extremity: Secondary | ICD-10-CM | POA: Diagnosis not present

## 2013-12-04 DIAGNOSIS — K5649 Other impaction of intestine: Secondary | ICD-10-CM | POA: Diagnosis not present

## 2013-12-04 DIAGNOSIS — I10 Essential (primary) hypertension: Secondary | ICD-10-CM | POA: Diagnosis not present

## 2013-12-04 DIAGNOSIS — I825Y9 Chronic embolism and thrombosis of unspecified deep veins of unspecified proximal lower extremity: Secondary | ICD-10-CM | POA: Diagnosis not present

## 2013-12-04 DIAGNOSIS — K7460 Unspecified cirrhosis of liver: Secondary | ICD-10-CM | POA: Diagnosis not present

## 2013-12-04 DIAGNOSIS — I7102 Dissection of abdominal aorta: Secondary | ICD-10-CM | POA: Diagnosis not present

## 2013-12-04 DIAGNOSIS — Z79899 Other long term (current) drug therapy: Secondary | ICD-10-CM | POA: Diagnosis not present

## 2013-12-04 DIAGNOSIS — R109 Unspecified abdominal pain: Secondary | ICD-10-CM | POA: Diagnosis not present

## 2013-12-04 DIAGNOSIS — N281 Cyst of kidney, acquired: Secondary | ICD-10-CM | POA: Diagnosis not present

## 2013-12-04 DIAGNOSIS — Z792 Long term (current) use of antibiotics: Secondary | ICD-10-CM | POA: Diagnosis not present

## 2013-12-04 DIAGNOSIS — R188 Other ascites: Secondary | ICD-10-CM | POA: Diagnosis not present

## 2013-12-04 LAB — URINALYSIS, COMPLETE
Bilirubin,UR: NEGATIVE
Glucose,UR: NEGATIVE mg/dL (ref 0–75)
Ketone: NEGATIVE
Leukocyte Esterase: NEGATIVE
NITRITE: NEGATIVE
Ph: 6 (ref 4.5–8.0)
Protein: NEGATIVE
SPECIFIC GRAVITY: 1.013 (ref 1.003–1.030)

## 2013-12-04 LAB — CBC WITH DIFFERENTIAL/PLATELET
BASOS ABS: 0.1 10*3/uL (ref 0.0–0.1)
BASOS PCT: 0.9 %
Eosinophil #: 0.1 10*3/uL (ref 0.0–0.7)
Eosinophil %: 1.9 %
HCT: 39.6 % (ref 35.0–47.0)
HGB: 12.5 g/dL (ref 12.0–16.0)
Lymphocyte #: 1.5 10*3/uL (ref 1.0–3.6)
Lymphocyte %: 24 %
MCH: 29.7 pg (ref 26.0–34.0)
MCHC: 31.5 g/dL — AB (ref 32.0–36.0)
MCV: 95 fL (ref 80–100)
MONO ABS: 0.5 x10 3/mm (ref 0.2–0.9)
Monocyte %: 7.9 %
NEUTROS ABS: 4.2 10*3/uL (ref 1.4–6.5)
NEUTROS PCT: 65.3 %
PLATELETS: 154 10*3/uL (ref 150–440)
RBC: 4.19 10*6/uL (ref 3.80–5.20)
RDW: 14.6 % — ABNORMAL HIGH (ref 11.5–14.5)
WBC: 6.5 10*3/uL (ref 3.6–11.0)

## 2013-12-04 LAB — COMPREHENSIVE METABOLIC PANEL
ALK PHOS: 149 U/L — AB
ALT: 23 U/L
AST: 49 U/L — AB (ref 15–37)
Albumin: 2.8 g/dL — ABNORMAL LOW (ref 3.4–5.0)
Anion Gap: 8 (ref 7–16)
BUN: 10 mg/dL (ref 7–18)
Bilirubin,Total: 0.7 mg/dL (ref 0.2–1.0)
Calcium, Total: 8.9 mg/dL (ref 8.5–10.1)
Chloride: 113 mmol/L — ABNORMAL HIGH (ref 98–107)
Co2: 18 mmol/L — ABNORMAL LOW (ref 21–32)
Creatinine: 0.86 mg/dL (ref 0.60–1.30)
EGFR (Non-African Amer.): >60
Glucose: 106 mg/dL — ABNORMAL HIGH (ref 65–99)
Osmolality: 277 (ref 275–301)
POTASSIUM: 3.7 mmol/L (ref 3.5–5.1)
SODIUM: 139 mmol/L (ref 136–145)
Total Protein: 8 g/dL (ref 6.4–8.2)

## 2013-12-04 LAB — TROPONIN I

## 2013-12-04 LAB — LIPASE, BLOOD: Lipase: 152 U/L (ref 73–393)

## 2013-12-05 DIAGNOSIS — I712 Thoracic aortic aneurysm, without rupture, unspecified: Secondary | ICD-10-CM | POA: Diagnosis not present

## 2013-12-05 DIAGNOSIS — I10 Essential (primary) hypertension: Secondary | ICD-10-CM | POA: Diagnosis not present

## 2013-12-05 DIAGNOSIS — I2510 Atherosclerotic heart disease of native coronary artery without angina pectoris: Secondary | ICD-10-CM | POA: Diagnosis not present

## 2013-12-05 DIAGNOSIS — K7460 Unspecified cirrhosis of liver: Secondary | ICD-10-CM | POA: Diagnosis not present

## 2013-12-05 DIAGNOSIS — Z23 Encounter for immunization: Secondary | ICD-10-CM | POA: Diagnosis not present

## 2013-12-16 ENCOUNTER — Other Ambulatory Visit: Payer: Self-pay | Admitting: Gastroenterology

## 2013-12-16 DIAGNOSIS — K7460 Unspecified cirrhosis of liver: Secondary | ICD-10-CM | POA: Diagnosis not present

## 2013-12-16 LAB — AMMONIA: Ammonia, Plasma: 71 mcmol/L — ABNORMAL HIGH (ref 11–32)

## 2013-12-27 ENCOUNTER — Ambulatory Visit: Payer: Self-pay | Admitting: Gastroenterology

## 2013-12-27 DIAGNOSIS — I10 Essential (primary) hypertension: Secondary | ICD-10-CM | POA: Diagnosis not present

## 2013-12-27 DIAGNOSIS — Z7901 Long term (current) use of anticoagulants: Secondary | ICD-10-CM | POA: Diagnosis not present

## 2013-12-27 DIAGNOSIS — K2970 Gastritis, unspecified, without bleeding: Secondary | ICD-10-CM | POA: Diagnosis not present

## 2013-12-27 DIAGNOSIS — K2950 Unspecified chronic gastritis without bleeding: Secondary | ICD-10-CM | POA: Diagnosis not present

## 2013-12-27 DIAGNOSIS — Z79899 Other long term (current) drug therapy: Secondary | ICD-10-CM | POA: Diagnosis not present

## 2013-12-27 DIAGNOSIS — R1010 Upper abdominal pain, unspecified: Secondary | ICD-10-CM | POA: Diagnosis not present

## 2013-12-27 DIAGNOSIS — K2940 Chronic atrophic gastritis without bleeding: Secondary | ICD-10-CM | POA: Diagnosis not present

## 2013-12-27 DIAGNOSIS — K745 Biliary cirrhosis, unspecified: Secondary | ICD-10-CM | POA: Diagnosis not present

## 2013-12-27 DIAGNOSIS — K7460 Unspecified cirrhosis of liver: Secondary | ICD-10-CM | POA: Diagnosis not present

## 2013-12-27 DIAGNOSIS — Z955 Presence of coronary angioplasty implant and graft: Secondary | ICD-10-CM | POA: Diagnosis not present

## 2013-12-27 DIAGNOSIS — I2510 Atherosclerotic heart disease of native coronary artery without angina pectoris: Secondary | ICD-10-CM | POA: Diagnosis not present

## 2013-12-30 DIAGNOSIS — I2510 Atherosclerotic heart disease of native coronary artery without angina pectoris: Secondary | ICD-10-CM | POA: Diagnosis not present

## 2013-12-30 DIAGNOSIS — I10 Essential (primary) hypertension: Secondary | ICD-10-CM | POA: Diagnosis not present

## 2013-12-30 DIAGNOSIS — I209 Angina pectoris, unspecified: Secondary | ICD-10-CM | POA: Diagnosis not present

## 2013-12-30 DIAGNOSIS — I82409 Acute embolism and thrombosis of unspecified deep veins of unspecified lower extremity: Secondary | ICD-10-CM | POA: Diagnosis not present

## 2013-12-30 LAB — PATHOLOGY REPORT

## 2014-04-30 DIAGNOSIS — H4011X3 Primary open-angle glaucoma, severe stage: Secondary | ICD-10-CM | POA: Diagnosis not present

## 2014-05-15 DIAGNOSIS — H4011X3 Primary open-angle glaucoma, severe stage: Secondary | ICD-10-CM | POA: Diagnosis not present

## 2014-05-30 ENCOUNTER — Emergency Department: Payer: Self-pay | Admitting: Emergency Medicine

## 2014-05-30 DIAGNOSIS — S0990XA Unspecified injury of head, initial encounter: Secondary | ICD-10-CM | POA: Diagnosis not present

## 2014-05-30 DIAGNOSIS — R1010 Upper abdominal pain, unspecified: Secondary | ICD-10-CM | POA: Diagnosis not present

## 2014-05-30 DIAGNOSIS — Z79899 Other long term (current) drug therapy: Secondary | ICD-10-CM | POA: Diagnosis not present

## 2014-05-30 DIAGNOSIS — I10 Essential (primary) hypertension: Secondary | ICD-10-CM | POA: Diagnosis not present

## 2014-05-30 DIAGNOSIS — Z9089 Acquired absence of other organs: Secondary | ICD-10-CM | POA: Diagnosis not present

## 2014-05-30 DIAGNOSIS — Z7901 Long term (current) use of anticoagulants: Secondary | ICD-10-CM | POA: Diagnosis not present

## 2014-05-30 DIAGNOSIS — R42 Dizziness and giddiness: Secondary | ICD-10-CM | POA: Diagnosis not present

## 2014-05-30 DIAGNOSIS — Z7902 Long term (current) use of antithrombotics/antiplatelets: Secondary | ICD-10-CM | POA: Diagnosis not present

## 2014-05-30 DIAGNOSIS — N390 Urinary tract infection, site not specified: Secondary | ICD-10-CM | POA: Diagnosis not present

## 2014-07-12 NOTE — Op Note (Signed)
PATIENT NAME:  Terri Bird, Terri Bird MR#:  [id number] DATE OF BIRTH:  1932-04-13  DATE OF PROCEDURE:  08/14/2013  PREOPERATIVE DIAGNOSES: 1.  Thoracic aortic aneurysm with epigastric and back pain.  2.  Renal mass.  3.  Coronary artery disease.  4.  Hypertension.   POSTOPERATIVE DIAGNOSES:   1.  Thoracic aortic aneurysm with epigastric and back pain.  2.  Renal mass.  3.  Coronary artery disease.  4.  Hypertension.   PROCEDURES PERFORMED:   1.  Ultrasound guidance for vascular access to bilateral femoral arteries; right by Dr. Lucky Cowboy, left by Dr. Delana Meyer.  2.  Catheter placement to aorta from right femoral approach by Dr. Lucky Cowboy.  3.  Catheter placement into branch hepatic artery from left femoral approach by Dr. Delana Meyer, third order catheter placement.  4.  Placement of a Gore Tag and thoracic endoprosthesis x 2 to the distal descending thoracic aorta using a 34 mm diameter x 10 cm in length endoprostheses.  5.  Viabahn stent placement to celiac artery x 2 in snorkel configuration for salvage of celiac artery during treatment of thoracic aneurysm.  6.  StarClose closure device of left femoral artery by Dr. Delana Meyer.  7.  ProGlide closure device of right femoral artery by Dr. Lucky Cowboy.   CO-SURGEONS: Dr. Lucky Cowboy and Dr. Delana Meyer.    ANESTHESIA:  General.   ESTIMATED BLOOD LOSS:  25 mL.   INDICATION FOR PROCEDURE:  This is a 81 year old African American female with a very large distal descending thoracic aortic aneurysm of greater than 7 cm in maximal diameter. This was possibly symptomatic with some back and epigastric pain. She has a renal cell mass that also is in the process of being worked up as well. Due to the size of the aneurysm and the high risk of rupture, repair is indicated. Risks and benefits were discussed. Informed consent was obtained.   DESCRIPTION OF PROCEDURE: The patient is brought to the vascular suite with the help of anesthesia providing general anesthetic. Her abdomen and groin were  sterilely prepped and draped and a sterile surgical field was created. I accessed the right femoral artery under ultrasound guidance and Dr. Delana Meyer did similarly on the left. A 6 French sheath was placed on the left. I placed 2 ProGlide devices in a preclose fashion on the right and then placed an 8 [place] sheath. The patient was systemically heparinized. A pigtail catheter was placed up into the thoracic aorta, and oblique arteriogram was performed of the thoracic aorta to opacify the level of the aneurysm and the celiac artery, which was in reasonably close proximity to the aneurysm.  It was planned that we would gain access to the celiac artery and snorkel the celiac artery to keep it patent and extend the stent graft beyond the origin of the celiac artery to cover the aneurysm. Getting stable catheter position in the celiac artery was very difficult and a significant amount of contrast and fluoroscopy was required to gain access to the celiac artery. We were never able to get a sheath well into the celiac artery, but we were able to get multiple catheters and wires, and essentially we got a VS1 catheter into the celiac, exchanged to a V18 wire and then a Glide catheter out to a branch of hepatic artery for stable wire and catheter position. I deployed an initial 34 mm diameter x 10 cm in length Tag endoprosthesis to seal proximally. With catheter placement into the celiac artery, I deployed a second  Tag endoprosthesis distally to cover down to the origin of the celiac artery, and we were less than 1 cm from the SMA. At this point, imaging showed that we did have seal and there was no flow seen within the aneurysm sac at this point. The trilobed balloon was used. We advanced the sheath to the origin of the celiac artery, removed the diagnostic catheter and placed a V18 wire well out the right hepatic. Through this, a 6 mm diameter x 5 cm in length Viabahn stent was initially deployed. This had less than 1 cm  purchase in the celiac artery due to stored energy, and I elected to extend a second 6 mm diameter x 5 cm in length Viabahn stent out into the main celiac artery near the origin of left gastric, but well before the bifurcation of the celiac and hepatic arteries. This was post dilated with a 6 mm balloon. Completion angiogram showed excellent flow through the thoracic stent grafts, through the stents in the celiac artery with good flow in the distal celiac artery, and no endoleak was seen. At this point, I elected to terminate the procedure. The 6 French sheath was closed with a StarClose closure device. A third ProGlide was placed on the right to gain hemostasis and hemostasis was achieved. 4-0 Monocryl was used to close the skin on both sides, and pressure dressings were placed. The patient tolerated the procedure well and was taken to the recovery room in stable condition.     ____________________________ Algernon Huxley, MD jsd:dmm D: 08/14/2013 14:11:00 ET T: 08/14/2013 20:42:12 ET JOB#: [id number]  cc: Algernon Huxley, MD, <Dictator> Kirstie Peri. Caryn Section, MD Algernon Huxley MD ELECTRONICALLY SIGNED 08/19/2013 14:51

## 2014-07-12 NOTE — Op Note (Signed)
PATIENT NAME:  Terri Bird, Terri Bird MR#:  [id number] DATE OF BIRTH:  11/06/1932  DATE OF PROCEDURE:  08/14/2013  PREOPERATIVE DIAGNOSIS:  Thoracic aortic aneurysm.   POSTOPERATIVE DIAGNOSIS: Thoracic aortic aneurysm.  PROCEDURES PERFORMED: 1.  Endovascular repair of thoracic aortic aneurysm using a GORE TAG device.  2.  Introduction catheter into visceral vessels, third order catheter placement.  3.  Placement of a Viabahn stent in the celiac access.   PROCEDURE PERFORMED BY:  Dr. Lucky Cowboy and Dr. Delana Meyer, co-surgeons.   ANESTHESIA:  General by endotracheal intubation.   FLUIDS:  Per anesthesia record.   ESTIMATED BLOOD LOSS:  Minimal.   SPECIMEN:  None.   FLUOROSCOPY TIME:  Approximately 50 minutes.   CONTRAST USED:  Isovue, approximately 150 mL.   INDICATIONS:  Ms. Birkhead is a 81 year old woman who has had a rapidly expanding thoracic aneurysm and is therefore undergoing repair. Risks and benefits were reviewed. All questions are answered. The patient agrees to proceed.   DESCRIPTION OF PROCEDURE:  The patient is taken to special procedures and placed in the supine position. After adequate general anesthesia induced, both groins are prepped and draped in a sterile fashion. Working simultaneously, Dr. Lucky Cowboy on the right, myself on the left, access is obtained to the common femoral arteries in a similar fashion. Ultrasound is placed in a sterile sleeve. Ultrasound is utilized secondary to lack of appropriate landmarks and to avoid vascular injury. Under real-time visualization, after recording the image for the permanent record, needle is inserted into the femoral artery and subsequently wires are advanced, and a 6 French sheath is placed. Again, on the right working in a pre-close fashion, 2 Perclose devices are deployed, but the knots are not secured, at the 11 o'clock  and 1 o'clock  positions. On the left, only a 6 [place] sheath will be required and this will be closed with a StarClose at the end  of the case.   Over the next 40 minutes or so, attempts at securing adequate purchase within the celiac access are undertaken. Multiple different wires and catheters are utilized. Ultimately, a VS1 catheter is seated in the celiac access and a floppy Glidewire is advanced out and Ansel sheath is placed. On the right side, a Lunderquist wire is then advanced after imaging of the thoracic aorta with the pigtail, and subsequently a 2 French sheath is advanced over the wire and positioned in the distal descending aorta.   5000 units of heparin is given and once there is some degree of purchase in the celiac, a 34 x 10 straight graft is opened and deployed. It ends approximately 1.5 cm above the celiac, which does not provide adequate seal, and therefore a second 34 x 10 is also deployed for the distal seal, and this covers the celiac. SMA is un-interfered with. Now, a Viabahn and 6 x 50 stent is advanced over the wire and deployed. It is not seated adequately in the celiac, and a second 6 x 50 stent is deployed extending into the celiac access by approximately 3 cm, and then out into the native aorta. This is then postdilated with a 6 x 4 balloon.   Pigtail catheter is advanced up the right and a bolus injection of contrast is utilized and demonstrates that there is adequate seal for the aneurysm. There are no endoleaks identified and there is patency of the Viabahn stents and both hepatic and splenic arteries are well visualized.   The sheath is then pulled and the Perclose  devices are deployed. A third device is needed, but this is sufficient for hemostasis. On the left side, a StarClose device is deployed.   INTERPRETATION: Initial images demonstrate the thoracic aneurysm. Once the 2 cuffs, 34 x 10 cm, have been deployed, there is exclusion of the aneurysm. Having achieved adequate wire purchase within the celiac, the catheter is advanced down into the tertiary branches of the hepatic, and subsequently the  wire is advanced and then the Viabahns are successfully deployed in proper positioning.   SUMMARY:  Successful exclusion of thoracic aortic aneurysm using a GORE TAG device with snorkeling of the celiac access.     ____________________________ Katha Cabal, MD ggs:dmm D: 08/14/2013 15:28:19 ET T: 08/14/2013 23:08:05 ET JOB#: [id number]  cc: Katha Cabal, MD, <Dictator> Katha Cabal MD ELECTRONICALLY SIGNED 08/20/2013 12:30

## 2014-07-15 DIAGNOSIS — R531 Weakness: Secondary | ICD-10-CM | POA: Diagnosis not present

## 2014-07-15 DIAGNOSIS — I2510 Atherosclerotic heart disease of native coronary artery without angina pectoris: Secondary | ICD-10-CM | POA: Diagnosis not present

## 2014-07-15 DIAGNOSIS — I480 Paroxysmal atrial fibrillation: Secondary | ICD-10-CM | POA: Diagnosis not present

## 2014-07-15 DIAGNOSIS — R600 Localized edema: Secondary | ICD-10-CM | POA: Diagnosis not present

## 2014-07-28 DIAGNOSIS — R531 Weakness: Secondary | ICD-10-CM | POA: Diagnosis not present

## 2014-07-28 DIAGNOSIS — R600 Localized edema: Secondary | ICD-10-CM | POA: Diagnosis not present

## 2014-07-28 DIAGNOSIS — I480 Paroxysmal atrial fibrillation: Secondary | ICD-10-CM | POA: Diagnosis not present

## 2014-10-29 DIAGNOSIS — I712 Thoracic aortic aneurysm, without rupture, unspecified: Secondary | ICD-10-CM | POA: Insufficient documentation

## 2014-10-29 DIAGNOSIS — M19019 Primary osteoarthritis, unspecified shoulder: Secondary | ICD-10-CM | POA: Insufficient documentation

## 2014-10-29 DIAGNOSIS — M549 Dorsalgia, unspecified: Secondary | ICD-10-CM | POA: Insufficient documentation

## 2014-10-29 DIAGNOSIS — Z86718 Personal history of other venous thrombosis and embolism: Secondary | ICD-10-CM | POA: Insufficient documentation

## 2014-10-29 DIAGNOSIS — K7460 Unspecified cirrhosis of liver: Secondary | ICD-10-CM | POA: Insufficient documentation

## 2014-10-29 DIAGNOSIS — K859 Acute pancreatitis without necrosis or infection, unspecified: Secondary | ICD-10-CM | POA: Insufficient documentation

## 2014-10-30 ENCOUNTER — Ambulatory Visit (INDEPENDENT_AMBULATORY_CARE_PROVIDER_SITE_OTHER): Payer: Commercial Managed Care - HMO | Admitting: Family Medicine

## 2014-10-30 VITALS — BP 160/70 | HR 70 | Temp 98.2°F | Resp 16 | Ht 60.25 in | Wt 129.0 lb

## 2014-10-30 DIAGNOSIS — E7800 Pure hypercholesterolemia, unspecified: Secondary | ICD-10-CM

## 2014-10-30 DIAGNOSIS — K921 Melena: Secondary | ICD-10-CM

## 2014-10-30 DIAGNOSIS — I2510 Atherosclerotic heart disease of native coronary artery without angina pectoris: Secondary | ICD-10-CM

## 2014-10-30 DIAGNOSIS — I10 Essential (primary) hypertension: Secondary | ICD-10-CM | POA: Diagnosis not present

## 2014-10-30 DIAGNOSIS — I2699 Other pulmonary embolism without acute cor pulmonale: Secondary | ICD-10-CM

## 2014-10-30 DIAGNOSIS — R5382 Chronic fatigue, unspecified: Secondary | ICD-10-CM | POA: Diagnosis not present

## 2014-10-30 NOTE — Patient Instructions (Signed)
Stop Plavix (clopidogrel) until we get results of labs and stool test

## 2014-10-30 NOTE — Progress Notes (Signed)
Patient: Terri Bird Female    DOB: 1932/11/24   81 y.o.   MRN: [id number] Visit Date: 10/30/2014  Today's Provider: Lelon Huh, MD   Chief Complaint  Patient presents with  . Hypertension  . Hyperlipidemia  . Coronary Artery Disease   Subjective:    HPI      Hypertension, follow-up:  BP Readings from Last 3 Encounters:  10/30/14 160/70  12/05/13 110/78    She was last seen for hypertension 11 months ago.  BP at that visit was  110/78. Management changes since that visit include  none. She reports good compliance with treatment. She is not having side effects.  She is not exercising. She is not adherent to low salt diet.   Outside blood pressures are  Not being checked. She is experiencing fatigue.  Patient denies chest pain, chest pressure/discomfort, dyspnea, exertional chest pressure/discomfort, palpitations and syncope.   Cardiovascular risk factors include advanced age (older than 75 for men, 43 for women), hypertension and sedentary lifestyle.  Use of agents associated with hypertension: none.     Weight trend: stable Wt Readings from Last 3 Encounters:  10/30/14 129 lb (58.514 kg)  12/05/13 124 lb (56.246 kg)    Current diet: in general, an "unhealthy" diet  ------------------------------------------------------------------------   Lipid/Cholesterol, Follow-up:   Last seen for this2 years ago.  Management changes since that visit include  none. . Last Lipid Panel:    Component Value Date/Time   CHOL 120 11/23/2012   TRIG 63 11/23/2012   HDL 48 11/23/2012   LDLCALC 59 11/23/2012    Risk factors for vascular disease include hypertension  She reports good compliance with treatment. She is not having side effects.  Current symptoms include none and have been stable. Weight trend: stable Prior visit with dietician: no Current diet: in general, an "unhealthy" diet Current exercise: none  Wt Readings from Last 3 Encounters:    10/30/14 129 lb (58.514 kg)  12/05/13 124 lb (56.246 kg)    -------------------------------------------------------------------  CAD Follow up: Last office visit was 11 months ago and no changes were made. Patient was advised to keep follow up with Dr. Clayborn Bigness. Since last visit patient reports that she was seen by Dr. Clayborn Bigness 2-3 months ago and no changes were made. Had Echo finding normal systolic function.   Blood in stool: Patients daughter states that when patient has a bowel movement it is occasionally red or dark red and is concerned there may be blood in stool. She has been chronically constipated, and states she has now not had a bowel movement for the last week.     No Known Allergies Previous Medications   AMLODIPINE (NORVASC) 5 MG TABLET    Take 1 tablet by mouth daily.   ATORVASTATIN (LIPITOR) 20 MG TABLET    Take 1 tablet by mouth daily.   BRIMONIDINE (ALPHAGAN) 0.2 % OPHTHALMIC SOLUTION    Apply 1 drop to eye 2 (two) times daily.   CLOPIDOGREL (PLAVIX) 75 MG TABLET    Take 1 tablet by mouth daily.   DICYCLOMINE (BENTYL) 10 MG CAPSULE    Take 1 capsule by mouth every 6 (six) hours as needed. For abdominal pain   DORZOLAMIDE-TIMOLOL (COSOPT) 22.3-6.8 MG/ML OPHTHALMIC SOLUTION    Apply to eye.   LACTULOSE (CHRONULAC) 10 GM/15ML SOLUTION    Take by mouth. 1 teaspoonful 2 times a day   LATANOPROST (XALATAN) 0.005 % OPHTHALMIC SOLUTION    Place 1 drop  into both eyes.   LISINOPRIL (PRINIVIL,ZESTRIL) 20 MG TABLET    Take 1 tablet by mouth daily.   METOPROLOL (LOPRESSOR) 100 MG TABLET    Take 1 tablet by mouth 2 (two) times daily.   RIFAXIMIN (XIFAXAN) 550 MG TABS TABLET    Take 1 tablet by mouth 2 (two) times daily.   RIVAROXABAN (XARELTO) 20 MG TABS TABLET    Take 1 tablet by mouth daily.    Review of Systems  Constitutional: Positive for fatigue. Negative for fever, chills and appetite change.  Respiratory: Negative for chest tightness.   Gastrointestinal: Positive for  abdominal pain (chronic, unchanged). Negative for nausea and vomiting. Blood in stool: stool was red 1 week ago;  patient unsure if it was blood.  Neurological: Positive for weakness. Negative for dizziness.    Social History  Substance Use Topics  . Smoking status: Never Smoker   . Smokeless tobacco: Not on file  . Alcohol Use: No   Objective:   BP 160/70 mmHg  Pulse 70  Temp(Src) 98.2 F (36.8 C) (Oral)  Resp 16  Ht 5' 0.25" (1.53 m)  Wt 129 lb (58.514 kg)  BMI 25.00 kg/m2  SpO2 97%  Physical Exam   General Appearance:    Alert, cooperative, no distress  Eyes:    PERRL, conjunctiva/corneas clear, EOM's intact       Lungs:     Clear to auscultation bilaterally, respirations unlabored  Heart:    Regular rate and rhythm  Neurologic:   Awake, alert, oriented x 3. No apparent focal neurological           defect.           Assessment & Plan:     1. CAD in native artery Asymptomatic. Compliant with medication.  Continue aggressive risk factor modification.  Continue routine follow up Dr. Clayborn Bigness  2. Essential hypertension Reviewed last note from Dr. Clayborn Bigness when SBP was 130. I'm hesitant to increase CCB due to her chronic constipation. Will continue current antihypertensives for the time being.  - Comprehensive metabolic panel  3. Pure hypercholesterolemia She is tolerating atorvastatin well with no adverse effects.   - Lipid panel  4. Pulmonary embolism and infarction On coumadin for life. Continue Xarelto - CBC  5. Chronic fatigue  - T4 AND TSH  6. Blood in stool Probably secondary to constipation. However she is at high bleeding risk with combination of Xarelto and Plavix. She is hemodynamically stable, and BP is actually a little high today. Will put Plavix on hold for the time being. If CBC is normal will considering resuming Plavix.         Lelon Huh, MD  [facility]

## 2014-10-31 ENCOUNTER — Other Ambulatory Visit (INDEPENDENT_AMBULATORY_CARE_PROVIDER_SITE_OTHER): Payer: Commercial Managed Care - HMO

## 2014-10-31 ENCOUNTER — Telehealth: Payer: Self-pay

## 2014-10-31 DIAGNOSIS — K921 Melena: Secondary | ICD-10-CM

## 2014-10-31 LAB — COMPREHENSIVE METABOLIC PANEL
A/G RATIO: 1.4 (ref 1.1–2.5)
ALT: 18 IU/L (ref 0–32)
AST: 38 IU/L (ref 0–40)
Albumin: 3.9 g/dL (ref 3.5–4.7)
Alkaline Phosphatase: 128 IU/L — ABNORMAL HIGH (ref 39–117)
BILIRUBIN TOTAL: 1 mg/dL (ref 0.0–1.2)
BUN/Creatinine Ratio: 16 (ref 11–26)
BUN: 15 mg/dL (ref 8–27)
CO2: 17 mmol/L — ABNORMAL LOW (ref 18–29)
Calcium: 9.7 mg/dL (ref 8.7–10.3)
Chloride: 108 mmol/L (ref 97–108)
Creatinine, Ser: 0.96 mg/dL (ref 0.57–1.00)
GFR, EST AFRICAN AMERICAN: 65 mL/min/{1.73_m2} (ref >59)
GFR, EST NON AFRICAN AMERICAN: 56 mL/min/{1.73_m2} — AB (ref >59)
GLOBULIN, TOTAL: 2.8 g/dL (ref 1.5–4.5)
Glucose: 97 mg/dL (ref 65–99)
POTASSIUM: 4.3 mmol/L (ref 3.5–5.2)
SODIUM: 142 mmol/L (ref 134–144)
TOTAL PROTEIN: 6.7 g/dL (ref 6.0–8.5)

## 2014-10-31 LAB — CBC
Hematocrit: 40.5 % (ref 34.0–46.6)
Hemoglobin: 14 g/dL (ref 11.1–15.9)
MCH: 31.7 pg (ref 26.6–33.0)
MCHC: 34.6 g/dL (ref 31.5–35.7)
MCV: 92 fL (ref 79–97)
Platelets: 127 10*3/uL — ABNORMAL LOW (ref 150–379)
RBC: 4.41 x10E6/uL (ref 3.77–5.28)
RDW: 14.4 % (ref 12.3–15.4)
WBC: 5.5 10*3/uL (ref 3.4–10.8)

## 2014-10-31 LAB — T4 AND TSH
T4, Total: 7.6 ug/dL (ref 4.5–12.0)
TSH: 2 u[IU]/mL (ref 0.450–4.500)

## 2014-10-31 LAB — LIPID PANEL
CHOLESTEROL TOTAL: 137 mg/dL (ref 100–199)
Chol/HDL Ratio: 2.5 ratio units (ref 0.0–4.4)
HDL: 55 mg/dL (ref >39)
LDL Calculated: 67 mg/dL (ref 0–99)
Triglycerides: 73 mg/dL (ref 0–149)
VLDL Cholesterol Cal: 15 mg/dL (ref 5–40)

## 2014-10-31 LAB — IFOBT (OCCULT BLOOD): IFOBT: NEGATIVE

## 2014-10-31 NOTE — Telephone Encounter (Signed)
Advised pt as directed below, pt verbalized fully understanding.   Thanks,   

## 2014-10-31 NOTE — Telephone Encounter (Signed)
-----   Message from [name], MD sent at 10/31/2014  7:26 AM EDT ----- Labs are all normal. Continue current medications.  Follow up 6 months.

## 2014-11-02 ENCOUNTER — Emergency Department: Payer: Commercial Managed Care - HMO

## 2014-11-02 ENCOUNTER — Encounter: Payer: Self-pay | Admitting: Emergency Medicine

## 2014-11-02 ENCOUNTER — Emergency Department
Admission: EM | Admit: 2014-11-02 | Discharge: 2014-11-02 | Disposition: A | Discharge status: Home / Self Care | Payer: Commercial Managed Care - HMO | Attending: Emergency Medicine | Admitting: Emergency Medicine

## 2014-11-02 DIAGNOSIS — Z7901 Long term (current) use of anticoagulants: Secondary | ICD-10-CM | POA: Insufficient documentation

## 2014-11-02 DIAGNOSIS — Z3202 Encounter for pregnancy test, result negative: Secondary | ICD-10-CM | POA: Insufficient documentation

## 2014-11-02 DIAGNOSIS — I10 Essential (primary) hypertension: Secondary | ICD-10-CM | POA: Diagnosis not present

## 2014-11-02 DIAGNOSIS — N939 Abnormal uterine and vaginal bleeding, unspecified: Secondary | ICD-10-CM | POA: Diagnosis not present

## 2014-11-02 DIAGNOSIS — Z792 Long term (current) use of antibiotics: Secondary | ICD-10-CM | POA: Diagnosis not present

## 2014-11-02 DIAGNOSIS — Z79899 Other long term (current) drug therapy: Secondary | ICD-10-CM | POA: Insufficient documentation

## 2014-11-02 DIAGNOSIS — N3090 Cystitis, unspecified without hematuria: Secondary | ICD-10-CM

## 2014-11-02 DIAGNOSIS — Z7902 Long term (current) use of antithrombotics/antiplatelets: Secondary | ICD-10-CM | POA: Insufficient documentation

## 2014-11-02 DIAGNOSIS — Z79811 Long term (current) use of aromatase inhibitors: Secondary | ICD-10-CM | POA: Diagnosis not present

## 2014-11-02 DIAGNOSIS — R109 Unspecified abdominal pain: Secondary | ICD-10-CM | POA: Diagnosis present

## 2014-11-02 DIAGNOSIS — R1013 Epigastric pain: Secondary | ICD-10-CM

## 2014-11-02 LAB — COMPREHENSIVE METABOLIC PANEL
ALBUMIN: 3.5 g/dL (ref 3.5–5.0)
ALT: 20 U/L (ref 14–54)
ANION GAP: 9 (ref 5–15)
AST: 44 U/L — ABNORMAL HIGH (ref 15–41)
Alkaline Phosphatase: 118 U/L (ref 38–126)
BILIRUBIN TOTAL: 0.9 mg/dL (ref 0.3–1.2)
BUN: 20 mg/dL (ref 6–20)
CALCIUM: 9.3 mg/dL (ref 8.9–10.3)
CHLORIDE: 112 mmol/L — AB (ref 101–111)
CO2: 21 mmol/L — AB (ref 22–32)
CREATININE: 1.06 mg/dL — AB (ref 0.44–1.00)
GFR calc non Af Amer: 48 mL/min — ABNORMAL LOW (ref >60)
GFR, EST AFRICAN AMERICAN: 56 mL/min — AB (ref >60)
Glucose, Bld: 103 mg/dL — ABNORMAL HIGH (ref 65–99)
POTASSIUM: 3.7 mmol/L (ref 3.5–5.1)
SODIUM: 142 mmol/L (ref 135–145)
Total Protein: 7.2 g/dL (ref 6.5–8.1)

## 2014-11-02 LAB — URINALYSIS COMPLETE WITH MICROSCOPIC (ARMC ONLY)
BILIRUBIN URINE: NEGATIVE
Bacteria, UA: NONE SEEN
GLUCOSE, UA: NEGATIVE mg/dL
Ketones, ur: NEGATIVE mg/dL
LEUKOCYTES UA: NEGATIVE
Nitrite: NEGATIVE
Protein, ur: 30 mg/dL — AB
Specific Gravity, Urine: 1.012 (ref 1.005–1.030)
pH: 6 (ref 5.0–8.0)

## 2014-11-02 LAB — CBC
HCT: 40.1 % (ref 35.0–47.0)
Hemoglobin: 13.2 g/dL (ref 12.0–16.0)
MCH: 31.1 pg (ref 26.0–34.0)
MCHC: 33 g/dL (ref 32.0–36.0)
MCV: 94.2 fL (ref 80.0–100.0)
Platelets: 101 10*3/uL — ABNORMAL LOW (ref 150–440)
RBC: 4.26 MIL/uL (ref 3.80–5.20)
RDW: 14.6 % — ABNORMAL HIGH (ref 11.5–14.5)
WBC: 6.1 10*3/uL (ref 3.6–11.0)

## 2014-11-02 LAB — LIPASE, BLOOD: LIPASE: 29 U/L (ref 22–51)

## 2014-11-02 LAB — POCT PREGNANCY, URINE: PREG TEST UR: NEGATIVE

## 2014-11-02 MED ORDER — NITROFURANTOIN MONOHYD MACRO 100 MG PO CAPS
100.0000 mg | ORAL_CAPSULE | Freq: Two times a day (BID) | ORAL | Status: DC
Start: 2014-11-02 — End: 2016-01-22

## 2014-11-02 NOTE — ED Notes (Signed)
Patient transported to Ultrasound 

## 2014-11-02 NOTE — ED Provider Notes (Signed)
Assumed care from Dr. Thomasene Lot. Ultrasound and urine are pending  Labs Reviewed  COMPREHENSIVE METABOLIC PANEL - Abnormal; Notable for the following:    Chloride 112 (*)    CO2 21 (*)    Glucose, Bld 103 (*)    Creatinine, Ser 1.06 (*)    AST 44 (*)    GFR calc non Af Amer 48 (*)    GFR calc Af Amer 56 (*)    All other components within normal limits  CBC - Abnormal; Notable for the following:    RDW 14.6 (*)    Platelets 101 (*)    All other components within normal limits  URINALYSIS COMPLETEWITH MICROSCOPIC ([facility] ONLY) - Abnormal; Notable for the following:    Color, Urine YELLOW (*)    APPearance CLEAR (*)    Hgb urine dipstick 3+ (*)    Protein, ur 30 (*)    Squamous Epithelial / LPF 0-5 (*)    All other components within normal limits  LIPASE, BLOOD  POCT PREGNANCY, URINE    IMPRESSION: 1. Limited examination which was unable to visualize the right ovary. 2. No acute findings. 3. In the setting of post-menopausal bleeding, this is consistent with a benign etiology such as endometrial atrophy. If bleeding remains unresponsive to hormonal or medical therapy, sonohysterogram should be considered for focal lesion work-up. (Ref: Radiological Reasoning: Algorithmic Workup of Abnormal Vaginal Bleeding with Endovaginal Sonography and Sonohysterography. AJR 2008; [id number]).   She'll be referred to OB/GYN for follow-up. Advise holding Xarelto if the bleeding is severe, if it continues to be very mild, she can continue taking for the time being.  Earleen Newport, MD 11/02/14 1006

## 2014-11-02 NOTE — ED Notes (Signed)
Patient discharged to home with family. Provided instruction as to need for follow up with OB/GYN and to take all medications as prescribed. Patient /Family verbalized understanding.

## 2014-11-02 NOTE — ED Notes (Signed)
Patient returned from Ultrasound. 

## 2014-11-02 NOTE — ED Provider Notes (Signed)
[facility] Emergency Department Provider Note  ____________________________________________  Time seen: 5:10 AM  I have reviewed the triage vital signs and the nursing notes.   HISTORY  Chief Complaint Abdominal Pain and Vaginal Bleeding     HPI Terri Bird is a 81 y.o. female presents to the emergency department due to pain in her abdomen and some noted vaginal bleeding.  The patient is on Xarelto. She has been on Xarelto for the past 3 years.  Her pain is in the epigastric area. She reports she has had this for a long time and it is no different from before. Her pain is mild to moderate. She has a history of cirrhosis. She denies nausea or vomiting.  The vaginal bleeding was noted last night. She has not had similar symptoms before. The bleeding is mild.  The patient has also complained of feeling "weak and dizzy". She is not able to characterize this further. She denies any change in her usual activities or abilities.   Past Medical History  Diagnosis Date  . History of DVT (deep vein thrombosis)   . Hypertension   . Hyperlipidemia   . Aneurysm, thoracic aortic   . Diverticulosis   . Cirrhosis   . Gastric ulcer   . IBS (irritable bowel syndrome)     Patient Active Problem List   Diagnosis Date Noted  . 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 Back pain 10/29/2014  . Hepatic cirrhosis 10/29/2014  . History of DVT of lower extremity 10/29/2014  . Pancreatitis 10/29/2014  . Thoracic aortic aneurysm 10/29/2014  . Lumbago 10/21/2010  . Diaphragmatic hernia without obstruction or gangrene 03/06/2009  . Irritable bowel syndrome 03/06/2009  . Diverticulosis of colon without hemorrhage 03/02/2009  . Chronic gastric ulcer 03/02/2009  . Pulmonary embolism and infarction 08/14/2008  . Abdominal pain, epigastric 08/03/2008  . Pure hypercholesterolemia 09/19/2007  . CAD in native artery 08/14/2007  . Constipation 12/22/2006  . Glaucoma 12/22/2006   . Essential hypertension 12/22/2006    Past Surgical History  Procedure Laterality Date  . Esophagogastroduodenoscopy  05/2009    Normal; [facility]  . US carotid doppler bilateral ([facility] hx)  05/2009    minimal plaque formation. Symptom: near syncope  . Upper gastrointestinal endoscopy  02/2009    Mild HH, Mild GERD  . Cholecystectomy  1987  . Parathyroid adenoma resection      [facility]    Current Outpatient Rx  Name  Route  Sig  Dispense  Refill  . amLODipine (NORVASC) 5 MG tablet   Oral   Take 5 mg by mouth daily.         Marland Kitchen atorvastatin (LIPITOR) 20 MG tablet   Oral   Take 20 mg by mouth daily.         . brimonidine (ALPHAGAN) 0.2 % ophthalmic solution   Ophthalmic   Apply 1 drop to eye 2 (two) times daily.         . clopidogrel (PLAVIX) 75 MG tablet   Oral   Take 75 mg by mouth daily.         Marland Kitchen dicyclomine (BENTYL) 10 MG capsule   Oral   Take 10 mg by mouth every 6 (six) hours as needed (abdominal pain).         . dorzolamide-timolol (COSOPT) 22.3-6.8 MG/ML ophthalmic solution   Both Eyes   Place 1 drop into both eyes 2 (two) times daily.         Marland Kitchen  lactulose (CHRONULAC) 10 GM/15ML solution   Oral   Take 10 g by mouth 2 (two) times daily as needed for mild constipation.         Marland Kitchen latanoprost (XALATAN) 0.005 % ophthalmic solution   Both Eyes   Place 1 drop into both eyes at bedtime.         Marland Kitchen lisinopril (PRINIVIL,ZESTRIL) 20 MG tablet   Oral   Take 20 mg by mouth daily.         . metoprolol (LOPRESSOR) 100 MG tablet   Oral   Take 100 mg by mouth 2 (two) times daily.         . rifaximin (XIFAXAN) 550 MG TABS tablet   Oral   Take 550 mg by mouth 2 (two) times daily.         . rivaroxaban (XARELTO) 20 MG TABS tablet   Oral   Take 20 mg by mouth daily with supper.           Allergies Review of patient's allergies indicates no known allergies.  Family History  Problem Relation Age of Onset  . Stomach cancer Mother    . Stroke Father     Social History Social History  Substance Use Topics  . Smoking status: Never Smoker   . Smokeless tobacco: None  . Alcohol Use: No    Review of Systems  Constitutional: Negative for fever. ENT: Negative for sore throat. Cardiovascular: Negative for chest pain. Respiratory: Negative for shortness of breath. Gastrointestinal: Notable for abdominal pain. See history of present illness Genitourinary: Notable for vaginal bleeding. See history of present illness  Musculoskeletal: No myalgias or injuries. Skin: Negative for rash. Neurological: Negative for headaches   10-point ROS otherwise negative.  ____________________________________________   PHYSICAL EXAM:  VITAL SIGNS: ED Triage Vitals  Enc Vitals Group     BP --      Pulse --      Resp --      Temp --      Temp src --      SpO2 --      Weight --      Height --      Head Cir --      Peak Flow --      Pain Score 11/02/14 0350 8     Pain Loc --      Pain Edu? --      Excl. in [place]? --     Constitutional: Alert and communicative. Well appearing and in no distress. ENT   Head: Normocephalic and atraumatic.   Nose: No congestion/rhinnorhea. Cardiovascular: Normal rate, regular rhythm, no murmur noted Respiratory:  Normal respiratory effort, no tachypnea.    Breath sounds are clear and equal bilaterally.  Gastrointestinal: Soft. Minimal tenderness in the epigastric area. No tenderness in the right or left upper quadrants or in the lower abdomen.. No distention.  Back: No muscle spasm, no tenderness, no CVA tenderness. Musculoskeletal: No deformity noted. Nontender with normal range of motion in all extremities.  No noted edema. Neurologic:  Normal speech and language. No gross focal neurologic deficits are appreciated.  Skin:  Skin is warm, dry. No rash noted. Psychiatric: Mood and affect are normal. Speech and behavior are normal.  ____________________________________________     LABS (pertinent positives/negatives)  Labs Reviewed  COMPREHENSIVE METABOLIC PANEL - Abnormal; Notable for the following:    Chloride 112 (*)    CO2 21 (*)    Glucose, Bld 103 (*)  Creatinine, Ser 1.06 (*)    AST 44 (*)    GFR calc non Af Amer 48 (*)    GFR calc Af Amer 56 (*)    All other components within normal limits  CBC - Abnormal; Notable for the following:    RDW 14.6 (*)    Platelets 101 (*)    All other components within normal limits  LIPASE, BLOOD  URINALYSIS COMPLETEWITH MICROSCOPIC ([facility] ONLY)     ____________________________________________   EKG  ED ECG REPORT I, Teven Mittman W, the attending physician, personally viewed and interpreted this ECG.   Date: 11/02/2014  EKG Time: 3:54 AM  Rate: 67  Rhythm: Normal sinus rhythm with right bundle branch block and a left anterior fascicular block. She has left ventricular hypertrophy  Axis: Normal  Intervals: QRS of 122, QTc 473  ST&T Change: None noted   ____________________________________________    RADIOLOGY  Pelvic ultrasound: Pending ____________________________________________   PROCEDURES   ____________________________________________   INITIAL IMPRESSION / ASSESSMENT AND PLAN / ED COURSE  Pertinent labs & imaging results that were available during my care of the patient were reviewed by me and considered in my medical decision making (see chart for details).  81 year old female with chronic abdominal pain which sounds similar to her usual, and a concern for new onset vaginal bleeding, on Xarelto.  The patient has been poked twice to attempt blood draw. The nurses up and unable to draw blood. We have called lab to attempt a blood draw.  ----------------------------------------- 7:23 AM on 11/02/2014 -----------------------------------------  Blood was obtained. Blood count is good with a normal white blood cell count and hemoglobin level 13.2. Renal function is good with BUN of  20, creatinine 1.06. Lipase is normal.  I believe the patient's abdominal pain is not acute nor severe. She appears comfortable.  Due to the complaint of possible vaginal bleeding, we will obtain an ultrasound of her pelvis.  I have discussed case with my colleague, Dr. Juanda Chance, who will assume care of the patient this hour.  ____________________________________________   FINAL CLINICAL IMPRESSION(S) / ED DIAGNOSES  Final diagnoses:  Epigastric pain  Vaginal bleeding      Ahmed Prima, MD 11/02/14 [phone]

## 2014-11-02 NOTE — ED Notes (Signed)
Received call from ultrasound to report that unless patient has had any part of a hysterectomy in the past she will need a negative pregnancy test prior to ultrasound.

## 2014-11-02 NOTE — Discharge Instructions (Signed)
Urinary Tract Infection Urinary tract infections (UTIs) can develop anywhere along your urinary tract. Your urinary tract is your body's drainage system for removing wastes and extra water. Your urinary tract includes two kidneys, two ureters, a bladder, and a urethra. Your kidneys are a pair of bean-shaped organs. Each kidney is about the size of your fist. They are located below your ribs, one on each side of your spine. CAUSES Infections are caused by microbes, which are microscopic organisms, including fungi, viruses, and bacteria. These organisms are so small that they can only be seen through a microscope. Bacteria are the microbes that most commonly cause UTIs. SYMPTOMS  Symptoms of UTIs may vary by age and gender of the patient and by the location of the infection. Symptoms in young women typically include a frequent and intense urge to urinate and a painful, burning feeling in the bladder or urethra during urination. Older women and men are more likely to be tired, shaky, and weak and have muscle aches and abdominal pain. A fever may mean the infection is in your kidneys. Other symptoms of a kidney infection include pain in your back or sides below the ribs, nausea, and vomiting. DIAGNOSIS To diagnose a UTI, your caregiver will ask you about your symptoms. Your caregiver also will ask to provide a urine sample. The urine sample will be tested for bacteria and white blood cells. White blood cells are made by your body to help fight infection. TREATMENT  Typically, UTIs can be treated with medication. Because most UTIs are caused by a bacterial infection, they usually can be treated with the use of antibiotics. The choice of antibiotic and length of treatment depend on your symptoms and the type of bacteria causing your infection. HOME CARE INSTRUCTIONS  If you were prescribed antibiotics, take them exactly as your caregiver instructs you. Finish the medication even if you feel better after you  have only taken some of the medication.  Drink enough water and fluids to keep your urine clear or pale yellow.  Avoid caffeine, tea, and carbonated beverages. They tend to irritate your bladder.  Empty your bladder often. Avoid holding urine for long periods of time.  Empty your bladder before and after sexual intercourse.  After a bowel movement, women should cleanse from front to back. Use each tissue only once. SEEK MEDICAL CARE IF:   You have back pain.  You develop a fever.  Your symptoms do not begin to resolve within 3 days. SEEK IMMEDIATE MEDICAL CARE IF:   You have severe back pain or lower abdominal pain.  You develop chills.  You have nausea or vomiting.  You have continued burning or discomfort with urination. MAKE SURE YOU:   Understand these instructions.  Will watch your condition.  Will get help right away if you are not doing well or get worse. Document Released: 12/15/2004 Document Revised: 09/06/2011 Document Reviewed: 04/15/2011 [facility] Patient Information 2015 [place], [place]. This information is not intended to replace advice given to you by your health care provider. Make sure you discuss any questions you have with your health care provider. Postmenopausal Bleeding Postmenopausal bleeding is any bleeding a woman has after she has entered into menopause. Menopause is the end of a woman's fertile years. After menopause, a woman no longer ovulates or has menstrual periods.  Postmenopausal bleeding can be caused by various things. Any type of postmenopausal bleeding, even if it appears to be a typical menstrual period, is concerning. This should be evaluated by  your health care provider. Any treatment will depend on the cause of the bleeding. HOME CARE INSTRUCTIONS Monitor your condition for any changes. The following actions may help to alleviate any discomfort you are experiencing:  Avoid the use of tampons and douches as directed by your health care  provider.  Change your pads frequently.  Get regular pelvic exams and Pap tests.  Keep all follow-up appointments for diagnostic tests as directed by your health care provider. SEEK MEDICAL CARE IF:   Your bleeding lasts more than 1 week.  You have abdominal pain.  You have bleeding with sexual intercourse. SEEK IMMEDIATE MEDICAL CARE IF:   You have a fever, chills, headache, dizziness, muscle aches, and bleeding.  You have severe pain with bleeding.  You are passing blood clots.  You have bleeding and need more than 1 pad an hour.  You feel faint. MAKE SURE YOU:  Understand these instructions.  Will watch your condition.  Will get help right away if you are not doing well or get worse. Document Released: 06/15/2005 Document Revised: 12/26/2012 Document Reviewed: 10/04/2012 [facility] Patient Information 2015 [place], [place]. This information is not intended to replace advice given to you by your health care provider. Make sure you discuss any questions you have with your health care provider.

## 2014-11-02 NOTE — ED Notes (Signed)
Attempted IV/blood draw x2 unsuccessful.  Per patient request did not attempt 3rd try.

## 2014-11-02 NOTE — ED Notes (Signed)
Family reports abd pain that began Friday and this am started with vaginal bleeding.

## 2014-11-03 ENCOUNTER — Telehealth: Payer: Self-pay | Admitting: Family Medicine

## 2014-11-03 DIAGNOSIS — N950 Postmenopausal bleeding: Secondary | ICD-10-CM

## 2014-11-03 NOTE — Telephone Encounter (Signed)
Please refer gyn for post menopausal bleeding.

## 2014-11-03 NOTE — Telephone Encounter (Addendum)
Per Dr. Caryn Section advised patient to stop taking both Xarelto and Plavix. Patient's daughter stated that she does not have an ob-gyn and wanted to know if pt should have a referral to see one or schedule another appt to see Caryn Section? Please advise?

## 2014-11-03 NOTE — Telephone Encounter (Signed)
Patient's daughter called office concerning patient being taking to ED on 11/02/2014 for vaginal bleeding. Earleen Newport, MD advised pt to stop Xarelto if bleeding is severe. Patient was advised to see ob-gyn. Patient's daughter stated that patient was advised to stop taking Plavix, the family is confused and would like Dr. Maralyn Sago opinion?

## 2014-11-03 NOTE — Telephone Encounter (Signed)
Pt daughter, Britt Boozer request a call back as soon as possible.  CB#[phone]/MW

## 2014-11-05 ENCOUNTER — Other Ambulatory Visit: Payer: Self-pay | Admitting: Gastroenterology

## 2014-11-05 DIAGNOSIS — K7460 Unspecified cirrhosis of liver: Secondary | ICD-10-CM

## 2014-11-10 ENCOUNTER — Ambulatory Visit: Admission: RE | Admit: 2014-11-10 | Payer: Commercial Managed Care - HMO | Source: Ambulatory Visit

## 2014-11-17 ENCOUNTER — Ambulatory Visit
Admission: RE | Admit: 2014-11-17 | Discharge: 2014-11-17 | Disposition: A | Discharge status: Home / Self Care | Payer: Commercial Managed Care - HMO | Source: Ambulatory Visit | Attending: Gastroenterology | Admitting: Gastroenterology

## 2014-11-17 DIAGNOSIS — N2889 Other specified disorders of kidney and ureter: Secondary | ICD-10-CM | POA: Diagnosis not present

## 2014-11-17 DIAGNOSIS — Z9049 Acquired absence of other specified parts of digestive tract: Secondary | ICD-10-CM | POA: Insufficient documentation

## 2014-11-17 DIAGNOSIS — K7460 Unspecified cirrhosis of liver: Secondary | ICD-10-CM | POA: Insufficient documentation

## 2014-12-19 ENCOUNTER — Other Ambulatory Visit: Payer: Self-pay | Admitting: Family Medicine

## 2014-12-30 ENCOUNTER — Encounter: Payer: Commercial Managed Care - HMO | Admitting: Obstetrics and Gynecology

## 2015-01-26 ENCOUNTER — Other Ambulatory Visit: Payer: Self-pay | Admitting: Family Medicine

## 2015-02-14 ENCOUNTER — Other Ambulatory Visit: Payer: Self-pay | Admitting: Family Medicine

## 2015-05-08 ENCOUNTER — Other Ambulatory Visit: Payer: Self-pay | Admitting: Family Medicine

## 2015-05-08 MED ORDER — LISINOPRIL 20 MG PO TABS: ORAL_TABLET | ORAL | Status: DC

## 2015-06-08 DIAGNOSIS — H401133 Primary open-angle glaucoma, bilateral, severe stage: Secondary | ICD-10-CM | POA: Diagnosis not present

## 2015-06-23 ENCOUNTER — Other Ambulatory Visit: Payer: Self-pay | Admitting: Family Medicine

## 2015-06-30 ENCOUNTER — Other Ambulatory Visit: Payer: Self-pay | Admitting: Family Medicine

## 2015-07-22 ENCOUNTER — Ambulatory Visit: Payer: Commercial Managed Care - HMO | Admitting: Family Medicine

## 2015-08-14 DIAGNOSIS — Z5321 Procedure and treatment not carried out due to patient leaving prior to being seen by health care provider: Secondary | ICD-10-CM | POA: Insufficient documentation

## 2015-08-14 DIAGNOSIS — R109 Unspecified abdominal pain: Secondary | ICD-10-CM | POA: Diagnosis present

## 2015-08-14 DIAGNOSIS — K7460 Unspecified cirrhosis of liver: Secondary | ICD-10-CM | POA: Diagnosis not present

## 2015-08-14 DIAGNOSIS — Z79899 Other long term (current) drug therapy: Secondary | ICD-10-CM | POA: Diagnosis not present

## 2015-08-14 DIAGNOSIS — R42 Dizziness and giddiness: Secondary | ICD-10-CM | POA: Diagnosis not present

## 2015-08-14 DIAGNOSIS — E785 Hyperlipidemia, unspecified: Secondary | ICD-10-CM | POA: Diagnosis not present

## 2015-08-14 DIAGNOSIS — K5900 Constipation, unspecified: Secondary | ICD-10-CM | POA: Diagnosis not present

## 2015-08-14 DIAGNOSIS — I10 Essential (primary) hypertension: Secondary | ICD-10-CM | POA: Diagnosis not present

## 2015-08-14 LAB — CBC
HEMATOCRIT: 41.4 % (ref 35.0–47.0)
Hemoglobin: 13.8 g/dL (ref 12.0–16.0)
MCH: 32.1 pg (ref 26.0–34.0)
MCHC: 33.3 g/dL (ref 32.0–36.0)
MCV: 96.3 fL (ref 80.0–100.0)
PLATELETS: 67 10*3/uL — AB (ref 150–440)
RBC: 4.3 MIL/uL (ref 3.80–5.20)
RDW: 14.2 % (ref 11.5–14.5)
WBC: 4.8 10*3/uL (ref 3.6–11.0)

## 2015-08-14 LAB — COMPREHENSIVE METABOLIC PANEL
ALT: 73 U/L — ABNORMAL HIGH (ref 14–54)
AST: 146 U/L — AB (ref 15–41)
Albumin: 3.5 g/dL (ref 3.5–5.0)
Alkaline Phosphatase: 146 U/L — ABNORMAL HIGH (ref 38–126)
Anion gap: 5 (ref 5–15)
BILIRUBIN TOTAL: 1.4 mg/dL — AB (ref 0.3–1.2)
BUN: 16 mg/dL (ref 6–20)
CO2: 21 mmol/L — ABNORMAL LOW (ref 22–32)
Calcium: 9.2 mg/dL (ref 8.9–10.3)
Chloride: 112 mmol/L — ABNORMAL HIGH (ref 101–111)
Creatinine, Ser: 0.99 mg/dL (ref 0.44–1.00)
GFR, EST NON AFRICAN AMERICAN: 52 mL/min — AB (ref >60)
Glucose, Bld: 139 mg/dL — ABNORMAL HIGH (ref 65–99)
POTASSIUM: 3.8 mmol/L (ref 3.5–5.1)
Sodium: 138 mmol/L (ref 135–145)
TOTAL PROTEIN: 7.7 g/dL (ref 6.5–8.1)

## 2015-08-14 LAB — LIPASE, BLOOD: Lipase: 47 U/L (ref 11–51)

## 2015-08-14 NOTE — ED Notes (Signed)
Family reports abdominal pain, dizziness and constipation since last week.

## 2015-08-15 ENCOUNTER — Encounter: Payer: Self-pay | Admitting: Emergency Medicine

## 2015-08-15 ENCOUNTER — Emergency Department
Admission: EM | Admit: 2015-08-15 | Discharge: 2015-08-15 | Disposition: A | Discharge status: Home / Self Care | Payer: Medicare Other | Source: Home / Self Care

## 2015-08-15 ENCOUNTER — Emergency Department
Admission: EM | Admit: 2015-08-15 | Discharge: 2015-08-15 | Disposition: A | Discharge status: Home / Self Care | Payer: Medicare Other | Attending: Emergency Medicine | Admitting: Emergency Medicine

## 2015-08-15 DIAGNOSIS — K5900 Constipation, unspecified: Secondary | ICD-10-CM | POA: Diagnosis not present

## 2015-08-15 DIAGNOSIS — E785 Hyperlipidemia, unspecified: Secondary | ICD-10-CM | POA: Insufficient documentation

## 2015-08-15 DIAGNOSIS — R42 Dizziness and giddiness: Secondary | ICD-10-CM | POA: Insufficient documentation

## 2015-08-15 DIAGNOSIS — Z79899 Other long term (current) drug therapy: Secondary | ICD-10-CM | POA: Insufficient documentation

## 2015-08-15 DIAGNOSIS — K7460 Unspecified cirrhosis of liver: Secondary | ICD-10-CM | POA: Insufficient documentation

## 2015-08-15 DIAGNOSIS — I10 Essential (primary) hypertension: Secondary | ICD-10-CM | POA: Insufficient documentation

## 2015-08-15 LAB — URINALYSIS COMPLETE WITH MICROSCOPIC (ARMC ONLY)
BILIRUBIN URINE: NEGATIVE
GLUCOSE, UA: NEGATIVE mg/dL
Ketones, ur: NEGATIVE mg/dL
Leukocytes, UA: NEGATIVE
NITRITE: NEGATIVE
Protein, ur: NEGATIVE mg/dL
SPECIFIC GRAVITY, URINE: 1.005 (ref 1.005–1.030)
pH: 7 (ref 5.0–8.0)

## 2015-08-15 LAB — TROPONIN I: Troponin I: <0.03 ng/mL (ref <0.031)

## 2015-08-15 MED ORDER — SODIUM CHLORIDE 0.9 % IV BOLUS (SEPSIS)
1000.0000 mL | Freq: Once | INTRAVENOUS | Status: AC
  Administered 2015-08-15: 1000 mL via INTRAVENOUS

## 2015-08-15 NOTE — Discharge Instructions (Signed)
Please call your cardiologist Monday morning to arrange a follow-up appointment as soon as possible to discuss further testing such as a possible Holter test. Your workup in the emergency department today including labs and urinalysis as well as her EKG appear normal. Please follow-up your doctor soon as possible, return to the emergency department for any concerning symptoms.   Dizziness Dizziness is a common problem. It is a feeling of unsteadiness or light-headedness. You may feel like you are about to faint. Dizziness can lead to injury if you stumble or fall. Anyone can become dizzy, but dizziness is more common in older adults. This condition can be caused by a number of things, including medicines, dehydration, or illness. HOME CARE INSTRUCTIONS Taking these steps may help with your condition: Eating and Drinking  Drink enough fluid to keep your urine clear or pale yellow. This helps to keep you from becoming dehydrated. Try to drink more clear fluids, such as water.  Do not drink alcohol.  Limit your caffeine intake if directed by your health care provider.  Limit your salt intake if directed by your health care provider. Activity  Avoid making quick movements.  Rise slowly from chairs and steady yourself until you feel okay.  In the morning, first sit up on the side of the bed. When you feel okay, stand slowly while you hold onto something until you know that your balance is fine.  Move your legs often if you need to stand in one place for a long time. Tighten and relax your muscles in your legs while you are standing.  Do not drive or operate heavy machinery if you feel dizzy.  Avoid bending down if you feel dizzy. Place items in your home so that they are easy for you to reach without leaning over. Lifestyle  Do not use any tobacco products, including cigarettes, chewing tobacco, or electronic cigarettes. If you need help quitting, ask your health care provider.  Try to  reduce your stress level, such as with yoga or meditation. Talk with your health care provider if you need help. General Instructions  Watch your dizziness for any changes.  Take medicines only as directed by your health care provider. Talk with your health care provider if you think that your dizziness is caused by a medicine that you are taking.  Tell a friend or a family member that you are feeling dizzy. If he or she notices any changes in your behavior, have this person call your health care provider.  Keep all follow-up visits as directed by your health care provider. This is important. SEEK MEDICAL CARE IF:  Your dizziness does not go away.  Your dizziness or light-headedness gets worse.  You feel nauseous.  You have reduced hearing.  You have new symptoms.  You are unsteady on your feet or you feel like the room is spinning. SEEK IMMEDIATE MEDICAL CARE IF:  You vomit or have diarrhea and are unable to eat or drink anything.  You have problems talking, walking, swallowing, or using your arms, hands, or legs.  You feel generally weak.  You are not thinking clearly or you have trouble forming sentences. It may take a friend or family member to notice this.  You have chest pain, abdominal pain, shortness of breath, or sweating.  Your vision changes.  You notice any bleeding.  You have a headache.  You have neck pain or a stiff neck.  You have a fever.   This information is not intended  to replace advice given to you by your health care provider. Make sure you discuss any questions you have with your health care provider.   Document Released: 08/31/2000 Document Revised: 07/22/2014 Document Reviewed: 03/03/2014 Elsevier Interactive Patient Education 2016 [employer].  Constipation, Adult Constipation is when a person:  Poops (has a bowel movement) less than 3 times a week.  Has a hard time pooping.  Has poop that is dry, hard, or bigger than normal. HOME  CARE   Eat foods with a lot of fiber in them. This includes fruits, vegetables, beans, and whole grains such as brown rice.  Avoid fatty foods and foods with a lot of sugar. This includes french fries, hamburgers, cookies, candy, and soda.  If you are not getting enough fiber from food, take products with added fiber in them (supplements).  Drink enough fluid to keep your pee (urine) clear or pale yellow.  Exercise on a regular basis, or as told by your doctor.  Go to the restroom when you feel like you need to poop. Do not hold it.  Only take medicine as told by your doctor. Do not take medicines that help you poop (laxatives) without talking to your doctor first. GET HELP RIGHT AWAY IF:   You have bright red blood in your poop (stool).  Your constipation lasts more than 4 days or gets worse.  You have belly (abdominal) or butt (rectal) pain.  You have thin poop (as thin as a pencil).  You lose weight, and it cannot be explained. MAKE SURE YOU:   Understand these instructions.  Will watch your condition.  Will get help right away if you are not doing well or get worse.   This information is not intended to replace advice given to you by your health care provider. Make sure you discuss any questions you have with your health care provider.   Document Released: 08/24/2007 Document Revised: 03/28/2014 Document Reviewed: 12/17/2012 Elsevier Interactive Patient Education [employer].

## 2015-08-15 NOTE — ED Notes (Addendum)
Pt to ed with c/o dizziness, constipation, abd pain.  Last bm was 4 days ago.  Pt was here last night and left before being seen.

## 2015-08-15 NOTE — ED Provider Notes (Signed)
[facility] Emergency Department Provider Note  Time seen: 7:58 AM  I have reviewed the triage vital signs and the nursing notes.   HISTORY  Chief Complaint Abdominal Pain; Dizziness; and Constipation    HPI Terri Bird is a 82 y.o. female with a past medical history of hypertension, hyperlipidemia, presents to the emergency department with abdominal discomfort, constipation and intermittent dizziness. According to the patient she has had stomach issues for years, states a history of intermittent constipation, her last bowel movement was 3-4 days ago. Patient also states an intermittent dizziness sensation which she describes more as a lightheadedness sensation over the past 1-2 weeks, but denies any currently. Denies any chest pain or trouble breathing. Denies any nausea, vomiting or diarrhea. Denies any fever. States the abdominal discomfort has been present for many years which she attributes to intermittent constipation. Denies any dysuria.     Past Medical History  Diagnosis Date  . History of DVT (deep vein thrombosis)   . Hypertension   . Hyperlipidemia   . Aneurysm, thoracic aortic ([place])   . Diverticulosis   . Cirrhosis ([place])   . Gastric ulcer   . IBS (irritable bowel syndrome)     Patient Active Problem List   Diagnosis Date Noted  . 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 Back pain 10/29/2014  . Hepatic cirrhosis ([place]) 10/29/2014  . History of DVT of lower extremity 10/29/2014  . Pancreatitis 10/29/2014  . Thoracic aortic aneurysm ([place]) 10/29/2014  . Lumbago 10/21/2010  . Diaphragmatic hernia without obstruction or gangrene 03/06/2009  . Irritable bowel syndrome 03/06/2009  . Diverticulosis of colon without hemorrhage 03/02/2009  . Chronic gastric ulcer 03/02/2009  . Pulmonary embolism and infarction ([place]) 08/14/2008  . Abdominal pain, epigastric 08/03/2008  . Pure hypercholesterolemia 09/19/2007  . CAD in native artery 08/14/2007  .  Constipation 12/22/2006  . Glaucoma 12/22/2006  . Essential hypertension 12/22/2006    Past Surgical History  Procedure Laterality Date  . Esophagogastroduodenoscopy  05/2009    Normal; [facility]  . US carotid doppler bilateral ([facility] hx)  05/2009    minimal plaque formation. Symptom: near syncope  . Upper gastrointestinal endoscopy  02/2009    Mild HH, Mild GERD  . Cholecystectomy  1987  . Parathyroid adenoma resection      [facility]    Current Outpatient Rx  Name  Route  Sig  Dispense  Refill  . amLODipine (NORVASC) 5 MG tablet      TAKE ONE (1) TABLET EACH DAY   30 tablet   12   . atorvastatin (LIPITOR) 20 MG tablet      TAKE 1 TABLET BY MOUTH ONCE DAILY   90 tablet   4   . brimonidine (ALPHAGAN) 0.2 % ophthalmic solution   Ophthalmic   Apply 1 drop to eye 2 (two) times daily.         . clopidogrel (PLAVIX) 75 MG tablet   Oral   Take 75 mg by mouth daily.         Marland Kitchen dicyclomine (BENTYL) 10 MG capsule   Oral   Take 10 mg by mouth every 6 (six) hours as needed (abdominal pain).         . dorzolamide-timolol (COSOPT) 22.3-6.8 MG/ML ophthalmic solution   Both Eyes   Place 1 drop into both eyes 2 (two) times daily.         Marland Kitchen lactulose (CHRONULAC) 10 GM/15ML solution   Oral   Take  10 g by mouth 2 (two) times daily as needed for mild constipation.         Marland Kitchen latanoprost (XALATAN) 0.005 % ophthalmic solution   Both Eyes   Place 1 drop into both eyes at bedtime.         Marland Kitchen lisinopril (PRINIVIL,ZESTRIL) 20 MG tablet      One tablet daily   90 tablet   4   . metoprolol (LOPRESSOR) 100 MG tablet      TAKE ONE TABLET TWICE DAILY   60 tablet   12   . nitrofurantoin, macrocrystal-monohydrate, (MACROBID) 100 MG capsule   Oral   Take 1 capsule (100 mg total) by mouth 2 (two) times daily.   20 capsule   0   . rifaximin (XIFAXAN) 550 MG TABS tablet   Oral   Take 550 mg by mouth 2 (two) times daily.         . rivaroxaban (XARELTO) 20 MG  TABS tablet   Oral   Take 20 mg by mouth daily with supper.           Allergies Review of patient's allergies indicates no known allergies.  Family History  Problem Relation Age of Onset  . Stomach cancer Mother   . Stroke Father     Social History Social History  Substance Use Topics  . Smoking status: Never Smoker   . Smokeless tobacco: None  . Alcohol Use: No    Review of Systems Constitutional: Negative for fever.Positive for intermittent dizziness/lightheadedness. Cardiovascular: Negative for chest pain. Respiratory: Negative for shortness of breath. Gastrointestinal: Positive for intermittent abdominal discomfort/chronic, positive for constipation/chronic, denies nausea, vomiting. Genitourinary: Negative for dysuria. Neurological: Negative for headaches, focal weakness or numbness. 10-point ROS otherwise negative.  ____________________________________________   PHYSICAL EXAM:  VITAL SIGNS: ED Triage Vitals  Enc Vitals Group     BP 08/15/15 0744 161/78 mmHg     Pulse Rate 08/15/15 0744 75     Resp 08/15/15 0744 20     Temp 08/15/15 0744 98.7 F (37.1 C)     Temp Source 08/15/15 0744 Oral     SpO2 08/15/15 0744 99 %     Weight 08/15/15 0744 125 lb (56.7 kg)     Height --      Head Cir --      Peak Flow --      Pain Score 08/15/15 0742 7     Pain Loc --      Pain Edu? --      Excl. in [place]? --     Constitutional: Alert and oriented. Well appearing and in no distress. Eyes: Normal exam ENT   Head: Normocephalic and atraumatic   Mouth/Throat: Mucous membranes are moist. Cardiovascular: Normal rate, regular rhythm. Respiratory: Normal respiratory effort without tachypnea nor retractions. Breath sounds are clear  Gastrointestinal: Soft and nontender. No distention. Musculoskeletal: Nontender with normal range of motion in all extremities. Neurologic:  Normal speech and language. No gross focal neurologic deficits  Skin:  Skin is warm, dry and  intact.  Psychiatric: Mood and affect are normal. Speech and behavior are normal.   ____________________________________________    EKG  EKG reviewed and interpreted by myself shows normal sinus rhythm at 72 bpm, left axis deviation, narrow QRS, normal intervals, nonspecific ST changes without ST elevation.  ____________________________________________     INITIAL IMPRESSION / ASSESSMENT AND PLAN / ED COURSE  Pertinent labs & imaging results that were available during my care of the patient  were reviewed by me and considered in my medical decision making (see chart for details).  Patient presents the emergency department for intermittent abdominal discomfort which she states has been present for years, constipation for the past 3-4 days that she states is fairly typical, and intermittent dizziness for the past several weeks. Denies any dizziness at this time. Denies any abdominal pain. Has a nontender abdominal exam. Patient presented to the emergency department last night, but states the wait was too long so she left. Patient's blood work from last night is within normal limits/at the patient's baseline. We will check a troponin, a urinalysis, EKG, an IV hydrate the patient while awaiting results. Overall the patient appears very well, no distress, has no complaints. When asked if she is dizzy she states no but she believes she was dizzy several days ago. When asked if she has any abdominal pain she says no but she has been constipated for 3-4 days. She states ongoing issues with constipation.  Labs are within normal limits including troponin and urinalysis. Lab work from overnight is normal. Patient continues to be symptom free in the emergency department. I discussed with the patient and her daughter to follow up with her cardiologist Dr. Clayborn Bigness for consideration of Holter monitor testing as the patient states intermittent lightheadedness symptoms. I have also discussed increasing fluids. I  discussed strict return precautions with the patient. Patient states she has not taken her lactulose and several weeks because it makes her stomach upset, I discussed with the patient and her family the importance of taking lactulose and this will help her constipation issues.  ____________________________________________   FINAL CLINICAL IMPRESSION(S) / ED DIAGNOSES  Constipation Dizziness   Harvest Dark, MD 08/15/15 [phone]

## 2015-08-18 DIAGNOSIS — R42 Dizziness and giddiness: Secondary | ICD-10-CM | POA: Diagnosis not present

## 2015-08-18 DIAGNOSIS — R0602 Shortness of breath: Secondary | ICD-10-CM | POA: Diagnosis not present

## 2015-08-18 DIAGNOSIS — I2510 Atherosclerotic heart disease of native coronary artery without angina pectoris: Secondary | ICD-10-CM | POA: Diagnosis not present

## 2015-08-18 DIAGNOSIS — I480 Paroxysmal atrial fibrillation: Secondary | ICD-10-CM | POA: Diagnosis not present

## 2015-08-18 DIAGNOSIS — E785 Hyperlipidemia, unspecified: Secondary | ICD-10-CM | POA: Diagnosis not present

## 2015-08-18 DIAGNOSIS — R600 Localized edema: Secondary | ICD-10-CM | POA: Diagnosis not present

## 2015-08-18 DIAGNOSIS — I208 Other forms of angina pectoris: Secondary | ICD-10-CM | POA: Diagnosis not present

## 2015-08-18 DIAGNOSIS — R531 Weakness: Secondary | ICD-10-CM | POA: Diagnosis not present

## 2015-08-25 DIAGNOSIS — R42 Dizziness and giddiness: Secondary | ICD-10-CM | POA: Diagnosis not present

## 2015-09-07 DIAGNOSIS — I208 Other forms of angina pectoris: Secondary | ICD-10-CM | POA: Diagnosis not present

## 2015-09-07 DIAGNOSIS — R0602 Shortness of breath: Secondary | ICD-10-CM | POA: Diagnosis not present

## 2015-09-07 DIAGNOSIS — I2510 Atherosclerotic heart disease of native coronary artery without angina pectoris: Secondary | ICD-10-CM | POA: Diagnosis not present

## 2015-11-02 ENCOUNTER — Other Ambulatory Visit: Payer: Self-pay | Admitting: Nurse Practitioner

## 2015-11-02 DIAGNOSIS — K7460 Unspecified cirrhosis of liver: Secondary | ICD-10-CM | POA: Diagnosis not present

## 2015-11-02 DIAGNOSIS — K7290 Hepatic failure, unspecified without coma: Secondary | ICD-10-CM | POA: Diagnosis not present

## 2015-11-02 DIAGNOSIS — R1013 Epigastric pain: Secondary | ICD-10-CM | POA: Diagnosis not present

## 2015-11-05 ENCOUNTER — Ambulatory Visit: Payer: Medicare Other

## 2015-11-09 ENCOUNTER — Ambulatory Visit
Admission: RE | Admit: 2015-11-09 | Discharge: 2015-11-09 | Disposition: A | Discharge status: Home / Self Care | Payer: Medicare Other | Source: Ambulatory Visit | Attending: Nurse Practitioner | Admitting: Nurse Practitioner

## 2015-11-09 DIAGNOSIS — K7460 Unspecified cirrhosis of liver: Secondary | ICD-10-CM | POA: Diagnosis not present

## 2015-12-07 DIAGNOSIS — H401133 Primary open-angle glaucoma, bilateral, severe stage: Secondary | ICD-10-CM | POA: Diagnosis not present

## 2016-01-22 ENCOUNTER — Encounter: Payer: Self-pay | Admitting: Family Medicine

## 2016-01-22 ENCOUNTER — Ambulatory Visit (INDEPENDENT_AMBULATORY_CARE_PROVIDER_SITE_OTHER): Payer: Medicare Other | Admitting: Family Medicine

## 2016-01-22 VITALS — BP 136/68 | HR 72 | Temp 97.9°F | Resp 20 | Ht 60.03 in | Wt 127.0 lb

## 2016-01-22 DIAGNOSIS — Z23 Encounter for immunization: Secondary | ICD-10-CM | POA: Diagnosis not present

## 2016-01-22 DIAGNOSIS — L83 Acanthosis nigricans: Secondary | ICD-10-CM | POA: Diagnosis not present

## 2016-01-22 NOTE — Progress Notes (Signed)
       Patient: Terri Bird Female    DOB: 13-Nov-1932   82 y.o.   MRN: [id number] Visit Date: 01/22/2016  Today's Provider: Lelon Huh, MD   Chief Complaint  Patient presents with  . Bleeding/Bruising   Subjective:    HPI Patient comes in today c/o discoloration on lower right leg since this morning. She denies any falls. She reports that she does have tenderness in the area where her bruise is located. Patient also has a knot located underneath her right ankle that she reports appeared this morning.     No Known Allergies   Current Outpatient Prescriptions:  .  amLODipine (NORVASC) 5 MG tablet, TAKE ONE (1) TABLET EACH DAY, Disp: 30 tablet, Rfl: 12 .  atorvastatin (LIPITOR) 20 MG tablet, TAKE 1 TABLET BY MOUTH ONCE DAILY, Disp: 90 tablet, Rfl: 4 .  brimonidine (ALPHAGAN) 0.2 % ophthalmic solution, Apply 1 drop to eye 2 (two) times daily., Disp: , Rfl:  .  clopidogrel (PLAVIX) 75 MG tablet, Take 75 mg by mouth daily., Disp: , Rfl:  .  dicyclomine (BENTYL) 10 MG capsule, Take 10 mg by mouth every 6 (six) hours as needed (abdominal pain)., Disp: , Rfl:  .  dorzolamide-timolol (COSOPT) 22.3-6.8 MG/ML ophthalmic solution, Place 1 drop into both eyes 2 (two) times daily., Disp: , Rfl:  .  lactulose (CHRONULAC) 10 GM/15ML solution, Take 10 g by mouth 2 (two) times daily as needed for mild constipation., Disp: , Rfl:  .  latanoprost (XALATAN) 0.005 % ophthalmic solution, Place 1 drop into both eyes at bedtime., Disp: , Rfl:  .  lisinopril (PRINIVIL,ZESTRIL) 20 MG tablet, One tablet daily, Disp: 90 tablet, Rfl: 4 .  metoprolol (LOPRESSOR) 100 MG tablet, TAKE ONE TABLET TWICE DAILY, Disp: 60 tablet, Rfl: 12 .  rifaximin (XIFAXAN) 550 MG TABS tablet, Take 550 mg by mouth 2 (two) times daily., Disp: , Rfl:  .  rivaroxaban (XARELTO) 20 MG TABS tablet, Take 20 mg by mouth daily with supper., Disp: , Rfl:   Review of Systems  Constitutional: Negative.   Skin: Negative.  Negative for  color change, pallor, rash and wound.  Neurological: Negative.   Hematological: Bruises/bleeds easily.    Social History  Substance Use Topics  . Smoking status: Never Smoker  . Smokeless tobacco: Not on file  . Alcohol use No   Objective:   BP 136/68 (BP Location: Left Arm, Patient Position: Sitting, Cuff Size: Normal)   Pulse 72   Temp 97.9 F (36.6 C)   Resp 20   Ht 5' 0.02" (1.525 m)   Wt 127 lb (57.6 kg)   BMI 24.78 kg/m   Physical Exam  Well defined hyperpigmented area right medial lower leg. No tenderness. No swelling.     Assessment & Plan:     1. Acanthosis nigricans Reassurance. Advised we could refer to dermatology if area enlarges or changes.   2. Need for influenza vaccination  - Flu vaccine HIGH DOSE PF (Fluzone High dose)     The entirety of the information documented in the History of Present Illness, Review of Systems and Physical Exam were personally obtained by me. Portions of this information were initially documented by Wilburt Finlay, CMA and reviewed by me for thoroughness and accuracy.    Lelon Huh, MD  [facility]

## 2016-01-22 NOTE — Patient Instructions (Addendum)
   Recommend taking 81mg  enteric coated aspirin to reduce risk of vascular events such as heart attacks and strokes.     Apply Eucerin cream to discolored area of skin on legs.    Call for dermatology referral if not doing better in 3-4 weeks.

## 2016-02-04 ENCOUNTER — Ambulatory Visit: Payer: Commercial Managed Care - HMO

## 2016-02-18 ENCOUNTER — Other Ambulatory Visit: Payer: Self-pay | Admitting: Family Medicine

## 2016-03-03 ENCOUNTER — Ambulatory Visit: Payer: Medicare Other

## 2016-04-19 ENCOUNTER — Ambulatory Visit: Payer: Medicare Other

## 2016-05-24 ENCOUNTER — Other Ambulatory Visit: Payer: Self-pay | Admitting: Gastroenterology

## 2016-05-24 DIAGNOSIS — R1013 Epigastric pain: Secondary | ICD-10-CM | POA: Diagnosis not present

## 2016-05-24 DIAGNOSIS — K7460 Unspecified cirrhosis of liver: Secondary | ICD-10-CM | POA: Diagnosis not present

## 2016-05-31 ENCOUNTER — Ambulatory Visit: Admission: RE | Admit: 2016-05-31 | Payer: Medicare Other | Source: Ambulatory Visit

## 2016-06-07 ENCOUNTER — Other Ambulatory Visit: Payer: Self-pay | Admitting: Family Medicine

## 2016-06-07 ENCOUNTER — Ambulatory Visit
Admission: RE | Admit: 2016-06-07 | Discharge: 2016-06-07 | Disposition: A | Discharge status: Home / Self Care | Payer: Medicare Other | Source: Ambulatory Visit | Attending: Gastroenterology | Admitting: Gastroenterology

## 2016-06-07 DIAGNOSIS — K7460 Unspecified cirrhosis of liver: Secondary | ICD-10-CM | POA: Diagnosis not present

## 2016-06-07 DIAGNOSIS — N281 Cyst of kidney, acquired: Secondary | ICD-10-CM | POA: Insufficient documentation

## 2016-06-07 DIAGNOSIS — Z9049 Acquired absence of other specified parts of digestive tract: Secondary | ICD-10-CM | POA: Diagnosis not present

## 2016-06-07 DIAGNOSIS — N2889 Other specified disorders of kidney and ureter: Secondary | ICD-10-CM | POA: Insufficient documentation

## 2016-06-07 NOTE — Telephone Encounter (Signed)
Pharmacy requesting refills. Thanks!  

## 2016-06-14 ENCOUNTER — Other Ambulatory Visit: Payer: Self-pay | Admitting: Gastroenterology

## 2016-06-14 DIAGNOSIS — N2889 Other specified disorders of kidney and ureter: Secondary | ICD-10-CM

## 2016-06-27 ENCOUNTER — Ambulatory Visit: Payer: Medicare Other

## 2016-06-29 ENCOUNTER — Ambulatory Visit
Admission: RE | Admit: 2016-06-29 | Discharge: 2016-06-29 | Disposition: A | Discharge status: Home / Self Care | Payer: Medicare Other | Source: Ambulatory Visit | Attending: Gastroenterology | Admitting: Gastroenterology

## 2016-06-29 DIAGNOSIS — N2889 Other specified disorders of kidney and ureter: Secondary | ICD-10-CM | POA: Diagnosis not present

## 2016-06-29 MED ORDER — GADOBENATE DIMEGLUMINE 529 MG/ML IV SOLN
10.0000 mL | Freq: Once | INTRAVENOUS | Status: AC | PRN
  Administered 2016-06-29: 10 mL via INTRAVENOUS

## 2016-07-12 ENCOUNTER — Other Ambulatory Visit: Payer: Self-pay | Admitting: Family Medicine

## 2016-07-12 ENCOUNTER — Telehealth: Payer: Self-pay | Admitting: Family Medicine

## 2016-07-12 NOTE — Telephone Encounter (Signed)
Called Pt to schedule AWV with [facility] - knb °

## 2016-07-19 ENCOUNTER — Ambulatory Visit: Payer: Medicare Other | Admitting: Urology

## 2016-07-19 ENCOUNTER — Encounter: Payer: Self-pay | Admitting: Urology

## 2016-07-29 ENCOUNTER — Ambulatory Visit (INDEPENDENT_AMBULATORY_CARE_PROVIDER_SITE_OTHER): Payer: Medicare Other | Admitting: Urology

## 2016-07-29 ENCOUNTER — Encounter: Payer: Self-pay | Admitting: Urology

## 2016-07-29 VITALS — BP 151/76 | HR 73 | Ht 62.0 in | Wt 127.0 lb

## 2016-07-29 DIAGNOSIS — N2889 Other specified disorders of kidney and ureter: Secondary | ICD-10-CM

## 2016-07-29 MED ORDER — DIAZEPAM 5 MG PO TABS
5.0000 mg | ORAL_TABLET | Freq: Once | ORAL | 0 refills | Status: AC
Start: 2016-07-29 — End: 2016-07-29

## 2016-07-29 NOTE — Progress Notes (Signed)
07/29/2016 10:06 AM   Terri Bird [id number]  Referring provider: [name], MD [address] [place], [place] [address]  Chief Complaint  Patient presents with  . New Patient (Initial Visit)    Kidney mass    HPI: 83 year old female referred for further evaluation of a known right upper pole renal mass. She was previously followed by Dr. Bernardo Heater last seen in 2015 for the same lesion. In the interim, the lesion has increased in size from 2.4 x 1.6 cm now measuring 3.6 x 3.3 cm.  On previous MRI, the lesion was characterized as a Bosniak 30F lesion. She was supposed return for follow-up in 3-6 months but failed to do so. On imaging today, there was some respiratory motion artifact, but possibly no representing a Bosniak 3 lesion as internal enhancement was able to be ruled out.  She denies any flank pain or gross hematuria.  She currently lives at home with her husband but does not do any of her own cooking or cleaning. She has multiple medical issues including a history of CAD and cirrhosis.  PMH: Past Medical History:  Diagnosis Date  . Aneurysm, thoracic aortic ([place])   . CAD in native artery 08/14/2007   followed by cardiology every six months.   . Cirrhosis ([place])   . Diverticulosis   . Gastric ulcer   . History of DVT (deep vein thrombosis)   . Hyperlipidemia   . Hypertension   . IBS (irritable bowel syndrome)     Surgical History: Past Surgical History:  Procedure Laterality Date  . CHOLECYSTECTOMY  1987  . ESOPHAGOGASTRODUODENOSCOPY  05/2009   Normal; [facility]  . Parathyroid Adenoma Resection     [facility]  . UPPER GASTROINTESTINAL ENDOSCOPY  02/2009   Mild HH, Mild GERD  . US CAROTID DOPPLER BILATERAL ([place] [facility]X)  05/2009   minimal plaque formation. Symptom: near syncope    Home Medications:  Allergies as of 07/29/2016   No Known Allergies     Medication List       Accurate as of 07/29/16 10:06 AM. Always use your  most recent med list.          amLODipine 5 MG tablet Commonly known as:  NORVASC TAKE ONE (1) TABLET EACH DAY   atorvastatin 20 MG tablet Commonly known as:  LIPITOR TAKE ONE (1) TABLET EACH DAY   brimonidine 0.2 % ophthalmic solution Commonly known as:  ALPHAGAN Apply 1 drop to eye 2 (two) times daily.   diazepam 5 MG tablet Commonly known as:  VALIUM Take 1 tablet (5 mg total) by mouth once.   dicyclomine 10 MG capsule Commonly known as:  BENTYL Take 10 mg by mouth every 6 (six) hours as needed (abdominal pain).   dorzolamide-timolol 22.3-6.8 MG/ML ophthalmic solution Commonly known as:  COSOPT Place 1 drop into both eyes 2 (two) times daily.   lactulose 10 GM/15ML solution Commonly known as:  CHRONULAC Take 10 g by mouth 2 (two) times daily as needed for mild constipation.   latanoprost 0.005 % ophthalmic solution Commonly known as:  XALATAN Place 1 drop into both eyes at bedtime.   lisinopril 20 MG tablet Commonly known as:  PRINIVIL,ZESTRIL TAKE ONE (1) TABLET EACH DAY   metoprolol 100 MG tablet Commonly known as:  LOPRESSOR TAKE ONE TABLET TWICE DAILY   rifaximin 550 MG Tabs tablet Commonly known as:  XIFAXAN Take 550 mg by mouth 2 (two) times daily.  Allergies: No Known Allergies  Family History: Family History  Problem Relation Age of Onset  . Stomach cancer Mother   . Stroke Father     Social History:  reports that she has never smoked. She does not have any smokeless tobacco history on file. She reports that she does not drink alcohol or use drugs.  ROS: UROLOGY Frequent Urination?: Yes Hard to postpone urination?: Yes Burning/pain with urination?: No Get up at night to urinate?: Yes Leakage of urine?: Yes Urine stream starts and stops?: Yes Trouble starting stream?: No Do you have to strain to urinate?: No Blood in urine?: No Urinary tract infection?: No Sexually transmitted disease?: No Injury to kidneys or bladder?:  No Painful intercourse?: No Weak stream?: No Currently pregnant?: No Vaginal bleeding?: No Last menstrual period?: n  Gastrointestinal Nausea?: No Vomiting?: No Indigestion/heartburn?: Yes Diarrhea?: No Constipation?: Yes  Constitutional Fever: No Night sweats?: No Weight loss?: No Fatigue?: No  Skin Skin rash/lesions?: Yes Itching?: No  Eyes Blurred vision?: No Double vision?: No  Ears/Nose/Throat Sore throat?: No Sinus problems?: No  Hematologic/Lymphatic Swollen glands?: No Easy bruising?: No  Cardiovascular Leg swelling?: Yes Chest pain?: No  Respiratory Cough?: No Shortness of breath?: No  Endocrine Excessive thirst?: No  Musculoskeletal Back pain?: Yes Joint pain?: No  Neurological Headaches?: Yes Dizziness?: No  Psychologic Depression?: No Anxiety?: No  Physical Exam: BP (!) 151/76   Pulse 73   Ht 5\' 2"  (1.575 m)   Wt 127 lb (57.6 kg)   BMI 23.23 kg/m   Constitutional:  Alert and oriented, No acute distress.  Accompanied by husband and son today. Sitting in wheelchair. HEENT: [place] AT, moist mucus membranes.  Trachea midline, no masses. Cardiovascular: No clubbing, cyanosis, or edema. Respiratory: Normal respiratory effort, no increased work of breathing. GI: Abdomen is soft, nontender, nondistended, no abdominal masses GU: No CVA tenderness.  Skin: No rashes, bruises or suspcious lesions. Neurologic: Grossly intact, no focal deficits, moving all 4 extremities. Psychiatric: Normal mood and affect.  Laboratory Data: Lab Results  Component Value Date   WBC 4.8 08/14/2015   HGB 13.8 08/14/2015   HCT 41.4 08/14/2015   MCV 96.3 08/14/2015   PLT 67 (L) 08/14/2015    Lab Results  Component Value Date   CREATININE 0.99 08/14/2015    Urinalysis    Component Value Date/Time   COLORURINE YELLOW (A) 08/15/2015 0852   APPEARANCEUR CLEAR (A) 08/15/2015 0852   APPEARANCEUR Clear 12/04/2013 1924   LABSPEC 1.005 08/15/2015 0852    LABSPEC 1.013 12/04/2013 1924   PHURINE 7.0 08/15/2015 0852   GLUCOSEU NEGATIVE 08/15/2015 0852   GLUCOSEU Negative 12/04/2013 1924   HGBUR 2+ (A) 08/15/2015 0852   BILIRUBINUR NEGATIVE 08/15/2015 0852   BILIRUBINUR Negative 12/04/2013 1924   KETONESUR NEGATIVE 08/15/2015 0852   PROTEINUR NEGATIVE 08/15/2015 0852   NITRITE NEGATIVE 08/15/2015 0852   LEUKOCYTESUR NEGATIVE 08/15/2015 0852   LEUKOCYTESUR Negative 12/04/2013 1924    Pertinent Imaging: CLINICAL DATA:  Evaluate kidney mass.  EXAM: MRI ABDOMEN WITHOUT AND WITH CONTRAST  TECHNIQUE: Multiplanar multisequence MR imaging of the abdomen was performed both before and after the administration of intravenous contrast.  CONTRAST:  34mL MULTIHANCE GADOBENATE DIMEGLUMINE 529 MG/ML IV SOLN  COMPARISON:  11/07/2013  FINDINGS: Lower chest: No acute findings.  Hepatobiliary: Macro nodular contour of the liver. 4 mm subcapsular lesion in the lateral segment of left lobe of liver demonstrates enhancement on the 45 second delayed post-contrast images. Previous cholecystectomy. No biliary dilatation.  Pancreas: No mass, inflammatory changes, or other parenchymal abnormality identified.  Spleen:  Within normal limits in size and appearance.  Adrenals/Urinary Tract: Normal adrenal glands. Exam detail diminished secondary to motion artifact. There are multiple left kidney cysts which are T2 hyperintense. The largest arises from the upper pole of the left kidney measuring 6.4 cm, image number 18 of series 8.  Within the right kidney there are several T1 hyperintense lesions lesions arising from the inferior pole. The largest is identified laterally measuring 4.6 cm. No enhancement identified within this lesion. Septated lesion arising from the upper pole of the right kidney measures 3.6 x 3.3 cm, image 5 of series 3. Previously this measured 2.4 x 1.6 cm. Cannot rule out enhancement within this septated  lesion.  Stomach/Bowel: Visualized portions within the abdomen are unremarkable.  Vascular/Lymphatic: Aortic atherosclerosis. Status post stent graft repair. No enlarged upper abdominal lymph nodes.  Other:  No free fluid or fluid collections.  Musculoskeletal: No suspicious bone lesions identified.  IMPRESSION: 1. Previously characterized Bosniak category 45f cystic lesion within the upper pole of right kidney demonstrates interval increase in size in the interval. Today's exam is limited secondary to motion artifact. Cannot rule out internal enhancement within this lesion which would place this in the Bosniak category 3. Further investigation with renal mass protocol CT is advised which may be less susceptible to respiratory motion artifact and can be compared with CTA from 12/04/2013. 2. The solid nodule within the left kidney identified on ultrasound from 06/07/2016 is not identified on the current exam which may reflect diminished sensitivity set could very to motion artifact. Recommend further investigation with renal mass protocol CT which would may be less susceptible to respiratory motion artifact. 3. Other Bosniak category 1 and 2 lesions are not significantly changed in the interval. 4. Morphologic features of liver compatible with cirrhosis.   Electronically Signed   By: Kerby Moors M.D.   On: 06/29/2016 11:40  MRI imaging was personally reviewed today along with with the patient and her family. This is also compared to previous MRI from 2015.  Assessment & Plan:    1. Renal mass Enlarging Bosniak 20F versus 3 right upper pole renal cyst, currently measuring 3.6 cm in largest diameter  We discussed this in detail and in regards to the spectrum of renal masses which includes cysts (pure cysts are considered benign), solid masses and everything in between. The risk of metastasis increases as the size of solid renal mass increases. In general, it is  believed that the risk of metastasis for renal masses less than 3-4 cm is small (up to approximately 5%) based mainly on large retrospective studies. In some cases and especially in patients of older age and multiple comorbidities a surveillance approach may be appropriate. The treatment of solid renal masses includes: surveillance, cryoablation (percutaneous and laparoscopic) in addition to partial and complete nephrectomy (each with option of laparoscopic, robotic and open depending on appropriateness). Furthermore, nephrectomy appears to be an independent risk factor for the development of chronic kidney disease suggesting that nephron sparing approaches should be implored whenever feasible. We reviewed these options in context of the patients current situation as well as the pros and cons of each.  For cystic renal masses, we reviewed the Bosniak classification and discussed that Bosniak 3 lesions harbor a 50% chance of malignancy whereas Bosniak 4 cysts have a solid and 90-95% are malignant in nature.   Lengthy discussion today about overall health, comorbidities, and risks and  benefits of intervention including renal biopsy. Specifically, in the situation of a cystic renal mass, biopsies can be inconclusive. Given her overall age, health, and comorbidities, I have recommended strong consideration of continued surveillance at this time as I do not feel she is a good surgical candidate. She and her family understand that there is a low risk of development of interval metastatic disease. Importantly, I recommended follow-up imaging in 6 months with repeat MR and discussed the importance of limiting motion as possible.  She was offered a second opinion a referral to academic urologist which she and her family declined at this time. They're comfortable with proceeding with follow-up repeat imaging in 6 months.  - MR ABDOMEN W WO CONTRAST; Future   Return in about 6 months (around 01/29/2017) for f/u  MRI.  Hollice Espy, MD  [facility] [address], [place], [place] [address] [phone]

## 2016-10-20 ENCOUNTER — Telehealth: Payer: Self-pay | Admitting: Family Medicine

## 2016-10-20 NOTE — Telephone Encounter (Signed)
Left message regarding need to schedule AWV.  She had no show January 2018-ab

## 2016-12-01 DIAGNOSIS — H401133 Primary open-angle glaucoma, bilateral, severe stage: Secondary | ICD-10-CM | POA: Diagnosis not present

## 2016-12-13 ENCOUNTER — Ambulatory Visit (INDEPENDENT_AMBULATORY_CARE_PROVIDER_SITE_OTHER): Payer: Medicare Other

## 2016-12-13 ENCOUNTER — Ambulatory Visit (INDEPENDENT_AMBULATORY_CARE_PROVIDER_SITE_OTHER): Payer: Medicare Other | Admitting: Family Medicine

## 2016-12-13 ENCOUNTER — Encounter: Payer: Self-pay | Admitting: Family Medicine

## 2016-12-13 VITALS — BP 110/70

## 2016-12-13 VITALS — BP 110/70 | HR 80 | Temp 97.6°F | Resp 16 | Wt 127.0 lb

## 2016-12-13 DIAGNOSIS — Z0000 Encounter for general adult medical examination without abnormal findings: Secondary | ICD-10-CM | POA: Diagnosis not present

## 2016-12-13 DIAGNOSIS — Z23 Encounter for immunization: Secondary | ICD-10-CM | POA: Diagnosis not present

## 2016-12-13 DIAGNOSIS — L309 Dermatitis, unspecified: Secondary | ICD-10-CM | POA: Diagnosis not present

## 2016-12-13 MED ORDER — TRIAMCINOLONE ACETONIDE 0.1 % EX LOTN: 1.0000 "application " | TOPICAL_LOTION | Freq: Two times a day (BID) | CUTANEOUS | 3 refills | Status: DC

## 2016-12-13 NOTE — Patient Instructions (Addendum)
[name] , Thank you for taking time to come for your Medicare Wellness Visit. I appreciate your ongoing commitment to your health goals. Please review the following plan we discussed and let me know if I can assist you in the future.   Screening recommendations/referrals: Colonoscopy: no longer required Mammogram: no longer required Bone Density: declined Recommended yearly ophthalmology/optometry visit for glaucoma screening and checkup Recommended yearly dental visit for hygiene and checkup  Vaccinations: Influenza vaccine: completed today Pneumococcal vaccine: Prevnar 13 completed, declined Pneumovax 23  Tdap vaccine: declined Shingles vaccine: declined  Advanced directives: Pt to check with family about if this has been completed or not then will follow up with [place] at next OV.  Conditions/risks identified: Recommend increasing water intake to 4 glasses a day.   Next appointment: Today @ 10:45 AM   Preventive Care 81 Years and Older, Female Preventive care refers to lifestyle choices and visits with your health care provider that can promote health and wellness. What does preventive care include?  A yearly physical exam. This is also called an annual well check.  Dental exams once or twice a year.  Routine eye exams. Ask your health care provider how often you should have your eyes checked.  Personal lifestyle choices, including:  Daily care of your teeth and gums.  Regular physical activity.  Eating a healthy diet.  Avoiding tobacco and drug use.  Limiting alcohol use.  Practicing safe sex.  Taking low-dose aspirin every day.  Taking vitamin and mineral supplements as recommended by your health care provider. What happens during an annual well check? The services and screenings done by your health care provider during your annual well check will depend on your age, overall health, lifestyle risk factors, and family history of disease. Counseling  Your health  care provider may ask you questions about your:  Alcohol use.  Tobacco use.  Drug use.  Emotional well-being.  Home and relationship well-being.  Sexual activity.  Eating habits.  History of falls.  Memory and ability to understand (cognition).  Work and work Statistician.  Reproductive health. Screening  You may have the following tests or measurements:  Height, weight, and BMI.  Blood pressure.  Lipid and cholesterol levels. These may be checked every 5 years, or more frequently if you are over 13 years old.  Skin check.  Lung cancer screening. You may have this screening every year starting at age 28 if you have a 30-pack-year history of smoking and currently smoke or have quit within the past 15 years.  Fecal occult blood test (FOBT) of the stool. You may have this test every year starting at age 81.  Flexible sigmoidoscopy or colonoscopy. You may have a sigmoidoscopy every 5 years or a colonoscopy every 10 years starting at age 81.  Hepatitis C blood test.  Hepatitis B blood test.  Sexually transmitted disease (STD) testing.  Diabetes screening. This is done by checking your blood sugar (glucose) after you have not eaten for a while (fasting). You may have this done every 1-3 years.  Bone density scan. This is done to screen for osteoporosis. You may have this done starting at age 12.  Mammogram. This may be done every 1-2 years. Talk to your health care provider about how often you should have regular mammograms. Talk with your health care provider about your test results, treatment options, and if necessary, the need for more tests. Vaccines  Your health care provider may recommend certain vaccines, such as:  Influenza vaccine. This is recommended every year.  Tetanus, diphtheria, and acellular pertussis (Tdap, Td) vaccine. You may need a Td booster every 10 years.  Zoster vaccine. You may need this after age 19.  Pneumococcal 13-valent conjugate  (PCV13) vaccine. One dose is recommended after age 33.  Pneumococcal polysaccharide (PPSV23) vaccine. One dose is recommended after age 84. Talk to your health care provider about which screenings and vaccines you need and how often you need them. This information is not intended to replace advice given to you by your health care provider. Make sure you discuss any questions you have with your health care provider. Document Released: 04/03/2015 Document Revised: 11/25/2015 Document Reviewed: 01/06/2015 Elsevier Interactive Patient Education  2017 [employer] Prevention in the Home Falls can cause injuries. They can happen to people of all ages. There are many things you can do to make your home safe and to help prevent falls. What can I do on the outside of my home?  Regularly fix the edges of walkways and driveways and fix any cracks.  Remove anything that might make you trip as you walk through a door, such as a raised step or threshold.  Trim any bushes or trees on the path to your home.  Use bright outdoor lighting.  Clear any walking paths of anything that might make someone trip, such as rocks or tools.  Regularly check to see if handrails are loose or broken. Make sure that both sides of any steps have handrails.  Any raised decks and porches should have guardrails on the edges.  Have any leaves, snow, or ice cleared regularly.  Use sand or salt on walking paths during winter.  Clean up any spills in your garage right away. This includes oil or grease spills. What can I do in the bathroom?  Use night lights.  Install grab bars by the toilet and in the tub and shower. Do not use towel bars as grab bars.  Use non-skid mats or decals in the tub or shower.  If you need to sit down in the shower, use a plastic, non-slip stool.  Keep the floor dry. Clean up any water that spills on the floor as soon as it happens.  Remove soap buildup in the tub or shower  regularly.  Attach bath mats securely with double-sided non-slip rug tape.  Do not have throw rugs and other things on the floor that can make you trip. What can I do in the bedroom?  Use night lights.  Make sure that you have a light by your bed that is easy to reach.  Do not use any sheets or blankets that are too big for your bed. They should not hang down onto the floor.  Have a firm chair that has side arms. You can use this for support while you get dressed.  Do not have throw rugs and other things on the floor that can make you trip. What can I do in the kitchen?  Clean up any spills right away.  Avoid walking on wet floors.  Keep items that you use a lot in easy-to-reach places.  If you need to reach something above you, use a strong step stool that has a grab bar.  Keep electrical cords out of the way.  Do not use floor polish or wax that makes floors slippery. If you must use wax, use non-skid floor wax.  Do not have throw rugs and other things on the floor that  can make you trip. What can I do with my stairs?  Do not leave any items on the stairs.  Make sure that there are handrails on both sides of the stairs and use them. Fix handrails that are broken or loose. Make sure that handrails are as long as the stairways.  Check any carpeting to make sure that it is firmly attached to the stairs. Fix any carpet that is loose or worn.  Avoid having throw rugs at the top or bottom of the stairs. If you do have throw rugs, attach them to the floor with carpet tape.  Make sure that you have a light switch at the top of the stairs and the bottom of the stairs. If you do not have them, ask someone to add them for you. What else can I do to help prevent falls?  Wear shoes that:  Do not have high heels.  Have rubber bottoms.  Are comfortable and fit you well.  Are closed at the toe. Do not wear sandals.  If you use a stepladder:  Make sure that it is fully  opened. Do not climb a closed stepladder.  Make sure that both sides of the stepladder are locked into place.  Ask someone to hold it for you, if possible.  Clearly mark and make sure that you can see:  Any grab bars or handrails.  First and last steps.  Where the edge of each step is.  Use tools that help you move around (mobility aids) if they are needed. These include:  Canes.  Walkers.  Scooters.  Crutches.  Turn on the lights when you go into a dark area. Replace any light bulbs as soon as they burn out.  Set up your furniture so you have a clear path. Avoid moving your furniture around.  If any of your floors are uneven, fix them.  If there are any pets around you, be aware of where they are.  Review your medicines with your doctor. Some medicines can make you feel dizzy. This can increase your chance of falling. Ask your doctor what other things that you can do to help prevent falls. This information is not intended to replace advice given to you by your health care provider. Make sure you discuss any questions you have with your health care provider. Document Released: 01/01/2009 Document Revised: 08/13/2015 Document Reviewed: 04/11/2014 Elsevier Interactive Patient Education  2017 [employer].

## 2016-12-13 NOTE — Progress Notes (Signed)
       Patient: Terri Bird Female    DOB: 23-Dec-1932   83 y.o.   MRN: [id number] Visit Date: 12/13/2016  Today's Provider: Lelon Huh, MD   Chief Complaint  Patient presents with  . Rash   Subjective:    Rash  This is a new problem. Episode onset: 1 month ago. The problem is unchanged. The affected locations include the left ankle and right ankle. The rash is characterized by dryness and itchiness. Pertinent negatives include no fatigue, fever, shortness of breath or vomiting. Treatments tried: Rubbing alcohol. The treatment provided no relief.       No Known Allergies   Current Outpatient Prescriptions:  .  amLODipine (NORVASC) 5 MG tablet, TAKE ONE (1) TABLET EACH DAY, Disp: 30 tablet, Rfl: 12 .  aspirin 81 MG tablet, Take 81 mg by mouth daily., Disp: , Rfl:  .  atorvastatin (LIPITOR) 20 MG tablet, TAKE ONE (1) TABLET EACH DAY, Disp: 90 tablet, Rfl: 4 .  brimonidine (ALPHAGAN) 0.2 % ophthalmic solution, Apply 1 drop to eye 2 (two) times daily., Disp: , Rfl:  .  dicyclomine (BENTYL) 10 MG capsule, Take 10 mg by mouth every 6 (six) hours as needed (abdominal pain)., Disp: , Rfl:  .  dorzolamide-timolol (COSOPT) 22.3-6.8 MG/ML ophthalmic solution, Place 1 drop into both eyes 2 (two) times daily., Disp: , Rfl:  .  lactulose (CHRONULAC) 10 GM/15ML solution, Take 10 g by mouth 2 (two) times daily as needed for mild constipation., Disp: , Rfl:  .  latanoprost (XALATAN) 0.005 % ophthalmic solution, Place 1 drop into both eyes at bedtime., Disp: , Rfl:  .  lisinopril (PRINIVIL,ZESTRIL) 20 MG tablet, TAKE ONE (1) TABLET EACH DAY, Disp: 90 tablet, Rfl: 4 .  metoprolol (LOPRESSOR) 100 MG tablet, TAKE ONE TABLET TWICE DAILY, Disp: 60 tablet, Rfl: 11 .  rifaximin (XIFAXAN) 550 MG TABS tablet, Take 550 mg by mouth 2 (two) times daily., Disp: , Rfl:   Review of Systems  Constitutional: Negative for appetite change, chills, fatigue and fever.  Respiratory: Negative for chest tightness  and shortness of breath.   Cardiovascular: Positive for leg swelling (mild swelling in ankles). Negative for chest pain and palpitations.  Gastrointestinal: Negative for abdominal pain, nausea and vomiting.  Skin: Positive for rash.       Dry itchy rash on both ankles  Neurological: Negative for dizziness and weakness.    Social History  Substance Use Topics  . Smoking status: Never Smoker  . Smokeless tobacco: Never Used  . Alcohol use No   Objective:   BP 110/70   Pulse 80   Temp 97.6 F (36.4 C) (Oral)   Resp 16   Wt 127 lb (57.6 kg)   BMI 23.23 kg/m  There were no vitals filed for this visit.   Physical Exam  Well defined dry flaky, mildly  hyperpigmented area left lateral lower leg. No tenderness. No swelling.    Assessment & Plan:     1. Eczema, unspecified type  - triamcinolone lotion (KENALOG) 0.1 %; Apply 1 application topically 2 (two) times daily.  Dispense: 60 mL; Refill: 3  Consider dermatology referral if not rapidly improving.       Lelon Huh, MD  [facility]

## 2016-12-13 NOTE — Progress Notes (Signed)
Subjective:   Terri Bird is a 83 y.o. female who presents for Medicare Annual (Subsequent) preventive examination.  Review of Systems:  N/A  Cardiac Risk Factors include: advanced age (>10men, >35 women);dyslipidemia;hypertension     Objective:     Vitals: BP 110/70 (BP Location: Left Arm)   There is no height or weight on file to calculate BMI.   Tobacco History  Smoking Status  . Never Smoker  Smokeless Tobacco  . Never Used     Counseling given: Not Answered   Past Medical History:  Diagnosis Date  . Aneurysm, thoracic aortic ([place])   . CAD in native artery 08/14/2007   followed by cardiology every six months.   . Cirrhosis ([place])   . Diverticulosis   . Gastric ulcer   . History of DVT (deep vein thrombosis)   . Hyperlipidemia   . Hypertension   . IBS (irritable bowel syndrome)    Past Surgical History:  Procedure Laterality Date  . CHOLECYSTECTOMY  1987  . ESOPHAGOGASTRODUODENOSCOPY  05/2009   Normal; [facility]  . Parathyroid Adenoma Resection     [facility]  . UPPER GASTROINTESTINAL ENDOSCOPY  02/2009   Mild HH, Mild GERD  . US CAROTID DOPPLER BILATERAL ([place] [facility])  05/2009   minimal plaque formation. Symptom: near syncope   Family History  Problem Relation Age of Onset  . Stomach cancer Mother   . Stroke Father    History  Sexual Activity  . Sexual activity: Not on file    Outpatient Encounter Prescriptions as of 12/13/2016  Medication Sig  . amLODipine (NORVASC) 5 MG tablet TAKE ONE (1) TABLET EACH DAY  . aspirin 81 MG tablet Take 81 mg by mouth daily.  Marland Kitchen atorvastatin (LIPITOR) 20 MG tablet TAKE ONE (1) TABLET EACH DAY  . brimonidine (ALPHAGAN) 0.2 % ophthalmic solution Apply 1 drop to eye 2 (two) times daily.  Marland Kitchen lactulose (CHRONULAC) 10 GM/15ML solution Take 10 g by mouth 2 (two) times daily as needed for mild constipation.  Marland Kitchen latanoprost (XALATAN) 0.005 % ophthalmic solution Place 1 drop into both eyes at bedtime.  Marland Kitchen lisinopril  (PRINIVIL,ZESTRIL) 20 MG tablet TAKE ONE (1) TABLET EACH DAY  . metoprolol (LOPRESSOR) 100 MG tablet TAKE ONE TABLET TWICE DAILY  . rifaximin (XIFAXAN) 550 MG TABS tablet Take 550 mg by mouth 2 (two) times daily.  Marland Kitchen dicyclomine (BENTYL) 10 MG capsule Take 10 mg by mouth every 6 (six) hours as needed (abdominal pain).  . dorzolamide-timolol (COSOPT) 22.3-6.8 MG/ML ophthalmic solution Place 1 drop into both eyes 2 (two) times daily.   No facility-administered encounter medications on file as of 12/13/2016.     Activities of Daily Living In your present state of health, do you have any difficulty performing the following activities: 12/13/2016  Hearing? N  Vision? Y  Comment glaucoma  Difficulty concentrating or making decisions? N  Walking or climbing stairs? Y  Dressing or bathing? N  Doing errands, shopping? N  Preparing Food and eating ? N  Using the Toilet? N  In the past six months, have you accidently leaked urine? N  Do you have problems with loss of bowel control? N  Managing your Medications? Y  Managing your Finances? Y  Housekeeping or managing your Housekeeping? Y  Some recent data might be hidden    Patient Care Team: [name], MD as PCP - General (Family Medicine) Oh, Lupita Dawn, MD (Inactive) as Physician Assistant (Internal Medicine) Cadwell, Karma Greaser  D, MD as Consulting Physician (Cardiology) Lucky Cowboy Erskine Squibb, MD as Referring Physician (Vascular Surgery)    Assessment:     Exercise Activities and Dietary recommendations Current Exercise Habits: The patient does not participate in regular exercise at present  Goals    . Increase water intake          Recommend increasing water intake to 4 glasses a day.       Fall Risk Fall Risk  12/13/2016  Falls in the past year? No   Depression Screen PHQ 2/9 Scores 12/13/2016  PHQ - 2 Score 2  PHQ- 9 Score 3     Cognitive Function        Immunization History  Administered Date(s) Administered  . Influenza,  High Dose Seasonal PF 01/22/2016, 12/13/2016  . Pneumococcal Conjugate-13 12/05/2013   Screening Tests Health Maintenance  Topic Date Due  . DEXA SCAN  03/21/2026 (Originally 05/19/1999)  . TETANUS/TDAP  03/21/2026 (Originally 05/18/1951)  . PNA vac Low Risk Adult (2 of 2 - PPSV23) 03/21/2026 (Originally 12/06/2014)  . INFLUENZA VACCINE  Completed      Plan:  I have personally reviewed and addressed the Medicare Annual Wellness questionnaire and have noted the following in the patient's chart:  A. Medical and social history B. Use of alcohol, tobacco or illicit drugs  C. Current medications and supplements D. Functional ability and status E.  Nutritional status F.  Physical activity G. Advance directives H. List of other physicians I.  Hospitalizations, surgeries, and ER visits in previous 12 months J.  [place] such as hearing and vision if needed, cognitive and depression L. Referrals and appointments - none  In addition, I have reviewed and discussed with patient certain preventive protocols, quality metrics, and best practice recommendations. A written personalized care plan for preventive services as well as general preventive health recommendations were provided to patient.  See attached scanned questionnaire for additional information.   Signed,  Fabio Neighbors, LPN Nurse Health Advisor   MD Recommendations: None. Pt declined DEXA referral, tetanus vaccine and Pneumovax 23 vaccine today.

## 2016-12-22 ENCOUNTER — Encounter: Payer: Self-pay | Admitting: Family Medicine

## 2017-01-11 ENCOUNTER — Telehealth: Payer: Self-pay | Admitting: Family Medicine

## 2017-01-31 ENCOUNTER — Ambulatory Visit
Admission: RE | Admit: 2017-01-31 | Discharge: 2017-01-31 | Disposition: A | Discharge status: Home / Self Care | Payer: Medicare Other | Source: Ambulatory Visit | Attending: Urology | Admitting: Urology

## 2017-01-31 DIAGNOSIS — K7689 Other specified diseases of liver: Secondary | ICD-10-CM | POA: Diagnosis not present

## 2017-01-31 DIAGNOSIS — N281 Cyst of kidney, acquired: Secondary | ICD-10-CM | POA: Insufficient documentation

## 2017-01-31 DIAGNOSIS — N289 Disorder of kidney and ureter, unspecified: Secondary | ICD-10-CM | POA: Diagnosis not present

## 2017-01-31 DIAGNOSIS — N2889 Other specified disorders of kidney and ureter: Secondary | ICD-10-CM | POA: Diagnosis not present

## 2017-01-31 LAB — POCT I-STAT CREATININE: CREATININE: 0.9 mg/dL (ref 0.44–1.00)

## 2017-01-31 MED ORDER — GADOBENATE DIMEGLUMINE 529 MG/ML IV SOLN
10.0000 mL | Freq: Once | INTRAVENOUS | Status: AC | PRN
  Administered 2017-01-31: 10 mL via INTRAVENOUS

## 2017-02-01 ENCOUNTER — Telehealth: Payer: Self-pay | Admitting: Urology

## 2017-02-01 ENCOUNTER — Ambulatory Visit (INDEPENDENT_AMBULATORY_CARE_PROVIDER_SITE_OTHER): Payer: Medicare Other | Admitting: Urology

## 2017-02-01 ENCOUNTER — Encounter: Payer: Self-pay | Admitting: Urology

## 2017-02-01 VITALS — BP 159/74 | HR 73 | Ht 60.0 in | Wt 135.0 lb

## 2017-02-01 DIAGNOSIS — K5900 Constipation, unspecified: Secondary | ICD-10-CM

## 2017-02-01 DIAGNOSIS — N281 Cyst of kidney, acquired: Secondary | ICD-10-CM

## 2017-02-01 NOTE — Telephone Encounter (Signed)
Patient's son notified that no medication is needed for her kidneys per Dr. Erlene Quan

## 2017-02-01 NOTE — Progress Notes (Signed)
02/01/2017 9:25 AM   Terri Bird December 07, 1932 [id number]  Referring provider: [name], MD [address] [place], [place] [address]  Chief Complaint  Patient presents with  . Renal Mass    MRI results    HPI: 83 year old female referred for further evaluation of a known right upper pole renal mass who returns for routine follow up.   She was previously followed by Dr. Bernardo Heater last seen in 2015 for the same lesion. In the interim, with interval size increase 2.4 x 1.6 cm now measuring 3.6 x 3.3 cm in 06/2016 on MR. the lesion was initially classified as a Bosniak 2 lesion, but follow up imaging was concerning for possible Bosniak 3 lesion although the image quality was degraded by motion artifact.  She returns today with repeat MRI yesterday.  This shows a stable right-sided lesion without interval increase in size.  Findings are most consistent with Bosniak 2 lesion.  She denies any flank pain or gross hematuria.  She currently lives at home with her husband but does not do any of her own cooking or cleaning. She has multiple medical issues including a history of CAD and cirrhosis.  She reports that she is "fair "today she has abdominal pain related to constipation.  She takes lactulose as needed.  When she takes this medication, she has diarrhea.  PMH: Past Medical History:  Diagnosis Date  . Aneurysm, thoracic aortic ([place])   . CAD in native artery 08/14/2007   followed by cardiology every six months.   . Cirrhosis ([place])   . Diverticulosis   . Gastric ulcer   . History of DVT (deep vein thrombosis)   . Hyperlipidemia   . Hypertension   . IBS (irritable bowel syndrome)     Surgical History: Past Surgical History:  Procedure Laterality Date  . CHOLECYSTECTOMY  1987  . ESOPHAGOGASTRODUODENOSCOPY  05/2009   Normal; [facility]  . Parathyroid Adenoma Resection     [facility]  . UPPER GASTROINTESTINAL ENDOSCOPY  02/2009   Mild HH, Mild GERD  .  US CAROTID DOPPLER BILATERAL ([place] [facility])  05/2009   minimal plaque formation. Symptom: near syncope    Home Medications:  Allergies as of 02/01/2017   No Known Allergies     Medication List        Accurate as of 02/01/17  9:25 AM. Always use your most recent med list.          amLODipine 5 MG tablet Commonly known as:  NORVASC TAKE ONE (1) TABLET EACH DAY   aspirin 81 MG tablet Take 81 mg by mouth daily.   atorvastatin 20 MG tablet Commonly known as:  LIPITOR TAKE ONE (1) TABLET EACH DAY   brimonidine 0.2 % ophthalmic solution Commonly known as:  ALPHAGAN Apply 1 drop to eye 2 (two) times daily.   dicyclomine 10 MG capsule Commonly known as:  BENTYL Take 10 mg by mouth every 6 (six) hours as needed (abdominal pain).   dorzolamide-timolol 22.3-6.8 MG/ML ophthalmic solution Commonly known as:  COSOPT Place 1 drop into both eyes 2 (two) times daily.   lactulose 10 GM/15ML solution Commonly known as:  CHRONULAC Take 10 g by mouth 2 (two) times daily as needed for mild constipation.   latanoprost 0.005 % ophthalmic solution Commonly known as:  XALATAN Place 1 drop into both eyes at bedtime.   lisinopril 20 MG tablet Commonly known as:  PRINIVIL,ZESTRIL TAKE ONE (1) TABLET EACH DAY  metoprolol tartrate 100 MG tablet Commonly known as:  LOPRESSOR TAKE ONE TABLET TWICE DAILY   nitroGLYCERIN 0.4 MG SL tablet Commonly known as:  NITROSTAT 0.4 mg.   rifaximin 550 MG Tabs tablet Commonly known as:  XIFAXAN Take 550 mg by mouth 2 (two) times daily.   traMADol-acetaminophen 37.5-325 MG tablet Commonly known as:  ULTRACET Take by mouth.   triamcinolone lotion 0.1 % Commonly known as:  KENALOG Apply 1 application topically 2 (two) times daily.       Allergies: No Known Allergies  Family History: Family History  Problem Relation Age of Onset  . Stomach cancer Mother   . Stroke Father     Social History:  reports that  has never smoked. she has never  used smokeless tobacco. She reports that she does not drink alcohol or use drugs.  ROS: UROLOGY Frequent Urination?: No Hard to postpone urination?: No Burning/pain with urination?: No Get up at night to urinate?: No Leakage of urine?: No Urine stream starts and stops?: No Trouble starting stream?: No Do you have to strain to urinate?: No Blood in urine?: No Urinary tract infection?: No Sexually transmitted disease?: No Injury to kidneys or bladder?: No Painful intercourse?: No Weak stream?: No Currently pregnant?: No Vaginal bleeding?: No Last menstrual period?: n  Gastrointestinal Nausea?: No Vomiting?: No Indigestion/heartburn?: No Diarrhea?: No Constipation?: Yes  Constitutional Fever: No Night sweats?: No Weight loss?: No Fatigue?: No  Skin Skin rash/lesions?: No Itching?: No  Eyes Blurred vision?: No Double vision?: No  Ears/Nose/Throat Sore throat?: No Sinus problems?: No  Hematologic/Lymphatic Swollen glands?: No Easy bruising?: No  Cardiovascular Leg swelling?: No Chest pain?: No  Respiratory Cough?: No Shortness of breath?: No  Endocrine Excessive thirst?: No  Musculoskeletal Back pain?: No Joint pain?: No  Neurological Headaches?: No Dizziness?: No  Psychologic Depression?: No Anxiety?: No  Physical Exam: BP (!) 159/74   Pulse 73   Ht 5' (1.524 m)   Wt 135 lb (61.2 kg)   BMI 26.37 kg/m   Constitutional:  Alert and oriented, No acute distress.  Accompanied by husband and daughter today. Sitting in wheelchair.  Elderly appearing.   HEENT: [place] AT, moist mucus membranes.  Trachea midline, no masses. Cardiovascular: No clubbing, cyanosis, or edema. Respiratory: Normal respiratory effort, no increased work of breathing. GI: Abdomen is soft, nontender, nondistended, no abdominal masses GU: No CVA tenderness.  Skin: No rashes, bruises or suspcious lesions. Neurologic: Grossly intact, no focal deficits, moving all 4  extremities. Psychiatric: Normal mood and affect.  Laboratory Data: Lab Results  Component Value Date   WBC 4.8 08/14/2015   HGB 13.8 08/14/2015   HCT 41.4 08/14/2015   MCV 96.3 08/14/2015   PLT 67 (L) 08/14/2015    Lab Results  Component Value Date   CREATININE 0.90 01/31/2017    Urinalysis N/a  Pertinent Imaging: MR 01/31/17  IMPRESSION: 1. Complex cystic lesion extending from the upper pole RIGHT kidney is not changed in size. No clear evidence enhancement of central nodular portion. Lesion remains Bosniak 68F. Exam is significantly degraded by respiratory motion on the post-contrast imaging. Strongly recommend follow-up imaging with CT renal protocol with contrast. 2. Additional nonenhancing hemorrhagic and nonhemorrhagic cysts in the LEFT and RIGHT kidney (Bosniak 1 and Bosniak 2) 3. Nodular liver consistent cirrhosis.   Electronically Signed   By: Suzy Bouchard M.D.   On: 01/31/2017 10:32  MRI images was personally reviewed today.  This was compared to previous MRI 6 months  ago.  Agree with radiologic interpretation as above.  Assessment & Plan:    1. Renal mass Bosniak 28F right upper pole renal cyst, currently measuring 3.6 cm in largest diameter, stable in size over past 6 months.  Most recent imaging confirms indeed that this is most consistent with Bosniak 28F rather than Bosniak 3.  Given her multiple medical comorbidities and lack of interval change as well as reassuring features, I recommended continued surveillance but with increased interval.  We will survey her at the 1 year interval with CT abdomen with and without contrast given her difficulty with motion artifact.  She is agreeable with this plan.  All questions answered.  2. Constipation, unspecified constipation type Advised to discuss with PCP   Return in about 1 year (around 02/01/2018).  Hollice Espy, MD  [facility] [address],  [place], [place] [address] [phone]

## 2017-02-01 NOTE — Telephone Encounter (Signed)
Pt's daughter in law wants to know if she needs any medication for kidneys.  Pt was seen this morning and she forgot to ask.  Please give her a call [phone]

## 2017-04-21 ENCOUNTER — Encounter: Payer: Self-pay | Admitting: Urology

## 2017-04-26 NOTE — Telephone Encounter (Signed)
Visit complete.

## 2017-05-18 ENCOUNTER — Other Ambulatory Visit: Payer: Self-pay | Admitting: Gastroenterology

## 2017-05-18 DIAGNOSIS — K7460 Unspecified cirrhosis of liver: Secondary | ICD-10-CM | POA: Diagnosis not present

## 2017-05-18 DIAGNOSIS — K5900 Constipation, unspecified: Secondary | ICD-10-CM | POA: Diagnosis not present

## 2017-05-18 DIAGNOSIS — R1013 Epigastric pain: Secondary | ICD-10-CM

## 2017-05-24 ENCOUNTER — Ambulatory Visit: Payer: Medicare Other | Attending: Gastroenterology

## 2017-06-14 ENCOUNTER — Ambulatory Visit: Payer: Medicare Other

## 2017-06-21 ENCOUNTER — Ambulatory Visit: Admission: RE | Admit: 2017-06-21 | Payer: Medicare Other | Source: Ambulatory Visit

## 2017-07-04 DIAGNOSIS — R1013 Epigastric pain: Secondary | ICD-10-CM | POA: Diagnosis not present

## 2017-07-04 DIAGNOSIS — K7460 Unspecified cirrhosis of liver: Secondary | ICD-10-CM | POA: Diagnosis not present

## 2017-07-04 DIAGNOSIS — K5900 Constipation, unspecified: Secondary | ICD-10-CM | POA: Diagnosis not present

## 2017-07-08 ENCOUNTER — Other Ambulatory Visit: Payer: Self-pay | Admitting: Family Medicine

## 2017-07-10 ENCOUNTER — Ambulatory Visit
Admission: RE | Admit: 2017-07-10 | Discharge: 2017-07-10 | Disposition: A | Discharge status: Home / Self Care | Payer: Medicare Other | Source: Ambulatory Visit | Attending: Gastroenterology | Admitting: Gastroenterology

## 2017-07-10 DIAGNOSIS — R1013 Epigastric pain: Secondary | ICD-10-CM | POA: Insufficient documentation

## 2017-07-10 DIAGNOSIS — K219 Gastro-esophageal reflux disease without esophagitis: Secondary | ICD-10-CM | POA: Insufficient documentation

## 2017-07-13 ENCOUNTER — Other Ambulatory Visit: Payer: Self-pay | Admitting: Family Medicine

## 2017-07-13 NOTE — Telephone Encounter (Signed)
Pharmacy requesting refills. Thanks!  

## 2017-07-20 ENCOUNTER — Other Ambulatory Visit: Payer: Self-pay | Admitting: *Deleted

## 2017-07-20 MED ORDER — LACTULOSE 10 GM/15ML PO SOLN: 10.0000 g | Freq: Two times a day (BID) | ORAL | 3 refills | Status: DC | PRN

## 2017-07-20 NOTE — Addendum Note (Signed)
Addended by: Lelon Huh E on: 07/20/2017 11:14 AM   Modules accepted: Orders

## 2017-08-12 IMAGING — MR MR ABDOMEN WO/W CM
7 of 17 series · 18 of 48 positions shown · IV contrast (multihance)
Comparison: 11/07/2013

CLINICAL DATA: Evaluate kidney mass.

EXAM:
MRI ABDOMEN WITHOUT AND WITH CONTRAST
TECHNIQUE: Multiplanar multisequence MR imaging of the abdomen was performed
both before and after the administration of intravenous contrast.
CONTRAST:  10mL MULTIHANCE GADOBENATE DIMEGLUMINE 529 MG/ML IV SOLN

[Series 2: T2 · coronal · 8.0mm · 1.64mm/px · 2 of 19 slices shown]
[im 1/19]
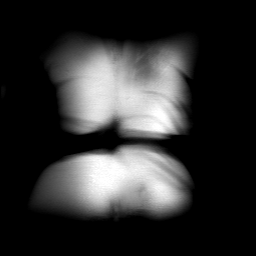
[im 19/19]
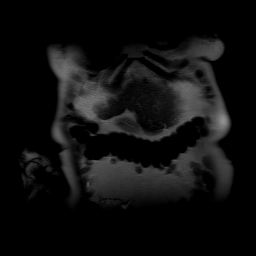

[Series 3: T2 fat-sat · axial · 8.0mm · 0.74mm/px · 1 of 23 slices shown]
[im 1/23]
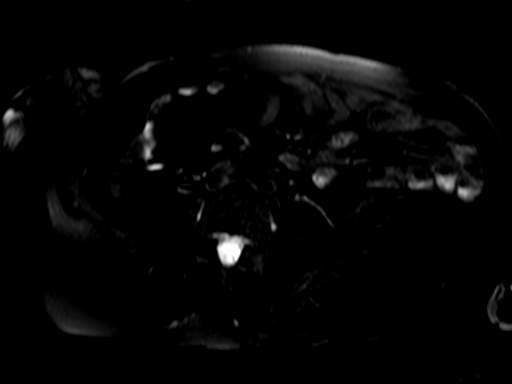

[Series 4: axial in-out of · axial · 8.0mm · 0.74mm/px · z∈[-57,+154]mm · 3 of 46 slices shown]
[im 1/46]
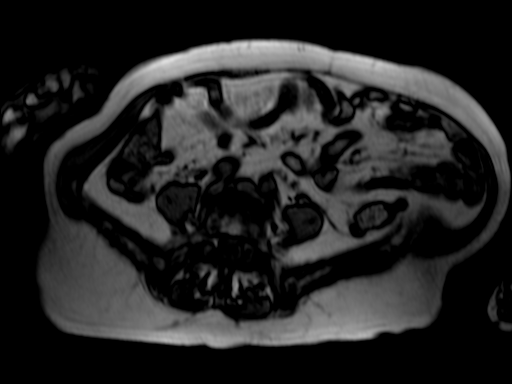
[im 23/46]
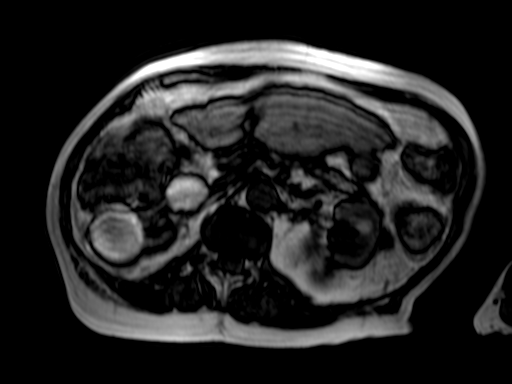
[im 46/46]
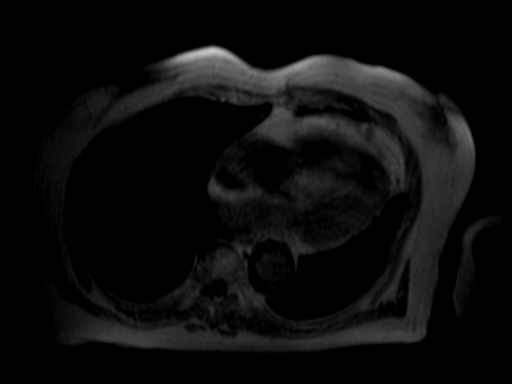

[Series 5: DWI · axial · 6.0mm · 2.97mm/px · z∈[-51,+158]mm · 5 of 89 slices shown]
[im 1/89]
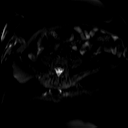
[im 23/89]
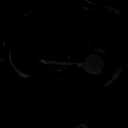
[im 45/89]
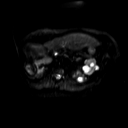
[im 67/89]
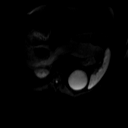
[im 89/89]
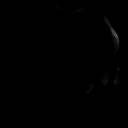

[Series 6: axial dwi_adc · axial · 6.0mm · 2.97mm/px · z∈[-51,+158]mm · 2 of 30 slices shown]
[im 1/30]
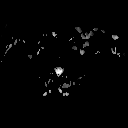
[im 30/30]
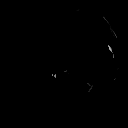

[Series 7: axial true fisp-- · axial · 5.0mm · 0.74mm/px · z∈[-51,+168]mm · 3 of 45 slices shown]
[im 1/45]
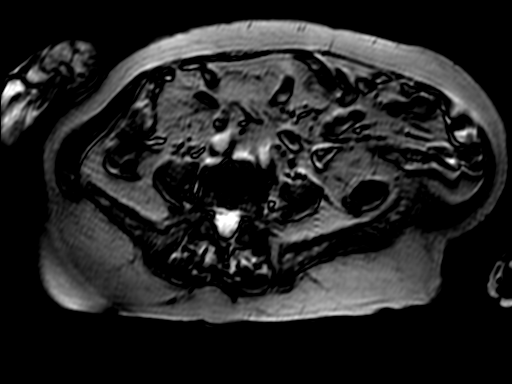
[im 23/45]
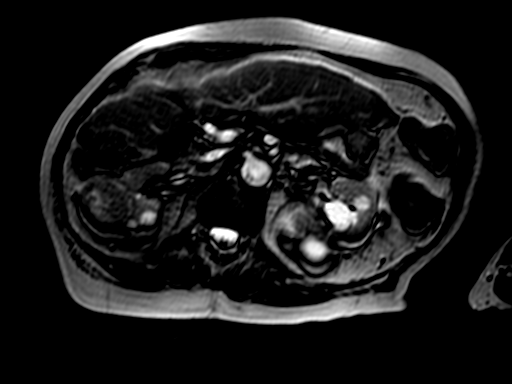
[im 45/45]
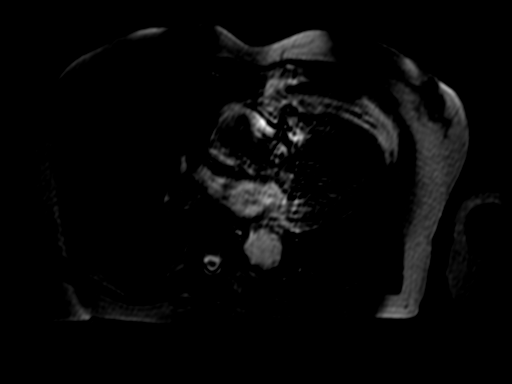

[Series 16: T2 post-contrast · axial · 8.0mm · 1.48mm/px · z∈[-62,+168]mm · 2 of 25 slices shown]
[im 1/25]
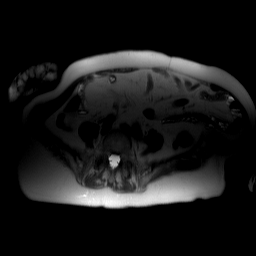
[im 25/25]
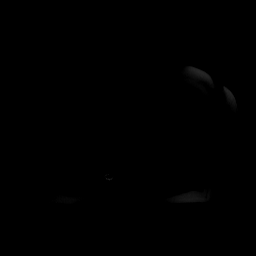

[18 of 48 positions shown; findings below may reference images not displayed]

FINDINGS: Lower chest: No acute findings.

Hepatobiliary: Macro nodular contour of the liver. 4 mm subcapsular
lesion in the lateral segment of left lobe of liver demonstrates
enhancement on the 45 second delayed post-contrast images. Previous
cholecystectomy. No biliary dilatation.

Pancreas: No mass, inflammatory changes, or other parenchymal
abnormality identified.

Spleen:  Within normal limits in size and appearance.

Adrenals/Urinary Tract: Normal adrenal glands. Exam detail
diminished secondary to motion artifact. There are multiple left
kidney cysts which are T2 hyperintense. The largest arises from the
upper pole of the left kidney measuring 6.4 cm, image number 18 of
series 8.

Within the right kidney there are several T1 hyperintense lesions
lesions arising from the inferior pole. The largest is identified
laterally measuring 4.6 cm. No enhancement identified within this
lesion. Septated lesion arising from the upper pole of the right
kidney measures 3.6 x 3.3 cm, image 5 of series 3. Previously this
measured 2.4 x 1.6 cm. Cannot rule out enhancement within this
septated lesion.

Stomach/Bowel: Visualized portions within the abdomen are
unremarkable.

Vascular/Lymphatic: Aortic atherosclerosis. Status post stent graft
repair. No enlarged upper abdominal lymph nodes.

Other:  No free fluid or fluid collections.

Musculoskeletal: No suspicious bone lesions identified.
IMPRESSION: 1. Previously characterized Bosniak category 2f cystic lesion within
the upper pole of right kidney demonstrates interval increase in
size in the interval. Today's exam is limited secondary to motion
artifact. Cannot rule out internal enhancement within this lesion
which would place this in the Bosniak category 3. Further
investigation with renal mass protocol CT is advised which may be
less susceptible to respiratory motion artifact and can be compared
with CTA from 12/04/2013.
2. The solid nodule within the left kidney identified on ultrasound
from 06/07/2016 is not identified on the current exam which may
reflect diminished sensitivity set could very to motion artifact.
Recommend further investigation with renal mass protocol CT which
would may be less susceptible to respiratory motion artifact.
3. Other Bosniak category 1 and 2 lesions are not significantly
changed in the interval.
4. Morphologic features of liver compatible with cirrhosis.

## 2017-11-06 ENCOUNTER — Telehealth: Payer: Self-pay

## 2017-11-06 NOTE — Telephone Encounter (Signed)
Called pt to schedule her AWV. NANM, will try again later. -MM

## 2017-11-16 NOTE — Telephone Encounter (Signed)
Per pt she has already been scheduled. AWV was scheduled for 9/05/23/17 @ 3 by TP. -MM

## 2017-11-22 ENCOUNTER — Telehealth: Payer: Self-pay

## 2017-11-22 NOTE — Telephone Encounter (Signed)
Pt's son Legrand Como returned call and rescheduled appt for 12/14/17 @ 140 pm with [facility] for AWV. Thanks TNP

## 2017-11-22 NOTE — Telephone Encounter (Signed)
Noted, thank you.  -MM 

## 2017-11-22 NOTE — Telephone Encounter (Signed)
Called pt to reschedule her AWV that is scheduled for tomorrow, 11/23/17. Pt has traditional Medicare and she is to wait 366 days before scheduling her next wellness. Pts last AWV was 12/13/16. Pt needs to schedule her AWV this year on 12/14/17 or after. Spoke with pt and advised her of this and she stated her son Legrand Como) takes care of her apts for her and to schedule with him. LMTCB on sons cell # on file. AWV cancelled for tomorrow.  -MM

## 2017-11-23 ENCOUNTER — Ambulatory Visit: Payer: Medicare Other

## 2017-12-14 ENCOUNTER — Ambulatory Visit: Payer: Medicare Other

## 2017-12-29 ENCOUNTER — Ambulatory Visit (INDEPENDENT_AMBULATORY_CARE_PROVIDER_SITE_OTHER): Payer: Medicare Other

## 2017-12-29 VITALS — BP 144/68 | HR 96 | Temp 98.0°F | Ht 60.0 in | Wt 117.0 lb

## 2017-12-29 DIAGNOSIS — Z0000 Encounter for general adult medical examination without abnormal findings: Secondary | ICD-10-CM | POA: Diagnosis not present

## 2017-12-29 DIAGNOSIS — E2839 Other primary ovarian failure: Secondary | ICD-10-CM

## 2017-12-29 NOTE — Patient Instructions (Signed)
Terri Bird , Thank you for taking time to come for your Medicare Wellness Visit. I appreciate your ongoing commitment to your health goals. Please review the following plan we discussed and let me know if I can assist you in the future.   Screening recommendations/referrals: Colonoscopy: N/A Mammogram: N/A Bone Density: Ordered today. Recommended yearly ophthalmology/optometry visit for glaucoma screening and checkup Recommended yearly dental visit for hygiene and checkup  Vaccinations: Influenza vaccine: Pt declines today.  Pneumococcal vaccine: Pt declines today.  Tdap vaccine: Pt declines today.  Shingles vaccine: Pt declines today.     Advanced directives: Advance directive discussed with you today. Even though you declined this today please call our office should you change your mind and we can give you the proper paperwork for you to fill out.  Conditions/risks identified: Fall risk prevention.   Next appointment: 01/19/18 @ 10 AM with Caryn Section.    Preventive Care 46 Years and Older, Female Preventive care refers to lifestyle choices and visits with your health care provider that can promote health and wellness. What does preventive care include?  A yearly physical exam. This is also called an annual well check.  Dental exams once or twice a year.  Routine eye exams. Ask your health care provider how often you should have your eyes checked.  Personal lifestyle choices, including:  Daily care of your teeth and gums.  Regular physical activity.  Eating a healthy diet.  Avoiding tobacco and drug use.  Limiting alcohol use.  Practicing safe sex.  Taking low-dose aspirin every day.  Taking vitamin and mineral supplements as recommended by your health care provider. What happens during an annual well check? The services and screenings done by your health care provider during your annual well check will depend on your age, overall health, lifestyle risk factors, and  family history of disease. Counseling  Your health care provider may ask you questions about your:  Alcohol use.  Tobacco use.  Drug use.  Emotional well-being.  Home and relationship well-being.  Sexual activity.  Eating habits.  History of falls.  Memory and ability to understand (cognition).  Work and work Statistician.  Reproductive health. Screening  You may have the following tests or measurements:  Height, weight, and BMI.  Blood pressure.  Lipid and cholesterol levels. These may be checked every 5 years, or more frequently if you are over 18 years old.  Skin check.  Lung cancer screening. You may have this screening every year starting at age 1 if you have a 30-pack-year history of smoking and currently smoke or have quit within the past 15 years.  Fecal occult blood test (FOBT) of the stool. You may have this test every year starting at age 22.  Flexible sigmoidoscopy or colonoscopy. You may have a sigmoidoscopy every 5 years or a colonoscopy every 10 years starting at age 56.  Hepatitis C blood test.  Hepatitis B blood test.  Sexually transmitted disease (STD) testing.  Diabetes screening. This is done by checking your blood sugar (glucose) after you have not eaten for a while (fasting). You may have this done every 1-3 years.  Bone density scan. This is done to screen for osteoporosis. You may have this done starting at age 106.  Mammogram. This may be done every 1-2 years. Talk to your health care provider about how often you should have regular mammograms. Talk with your health care provider about your test results, treatment options, and if necessary, the need for more tests.  Vaccines  Your health care provider may recommend certain vaccines, such as:  Influenza vaccine. This is recommended every year.  Tetanus, diphtheria, and acellular pertussis (Tdap, Td) vaccine. You may need a Td booster every 10 years.  Zoster vaccine. You may need this  after age 70.  Pneumococcal 13-valent conjugate (PCV13) vaccine. One dose is recommended after age 48.  Pneumococcal polysaccharide (PPSV23) vaccine. One dose is recommended after age 21. Talk to your health care provider about which screenings and vaccines you need and how often you need them. This information is not intended to replace advice given to you by your health care provider. Make sure you discuss any questions you have with your health care provider. Document Released: 04/03/2015 Document Revised: 11/25/2015 Document Reviewed: 01/06/2015 Elsevier Interactive Patient Education  2017 [place] Prevention in the Home Falls can cause injuries. They can happen to people of all ages. There are many things you can do to make your home safe and to help prevent falls. What can I do on the outside of my home?  Regularly fix the edges of walkways and driveways and fix any cracks.  Remove anything that might make you trip as you walk through a door, such as a raised step or threshold.  Trim any bushes or trees on the path to your home.  Use bright outdoor lighting.  Clear any walking paths of anything that might make someone trip, such as rocks or tools.  Regularly check to see if handrails are loose or broken. Make sure that both sides of any steps have handrails.  Any raised decks and porches should have guardrails on the edges.  Have any leaves, snow, or ice cleared regularly.  Use sand or salt on walking paths during winter.  Clean up any spills in your garage right away. This includes oil or grease spills. What can I do in the bathroom?  Use night lights.  Install grab bars by the toilet and in the tub and shower. Do not use towel bars as grab bars.  Use non-skid mats or decals in the tub or shower.  If you need to sit down in the shower, use a plastic, non-slip stool.  Keep the floor dry. Clean up any water that spills on the floor as soon as it  happens.  Remove soap buildup in the tub or shower regularly.  Attach bath mats securely with double-sided non-slip rug tape.  Do not have throw rugs and other things on the floor that can make you trip. What can I do in the bedroom?  Use night lights.  Make sure that you have a light by your bed that is easy to reach.  Do not use any sheets or blankets that are too big for your bed. They should not hang down onto the floor.  Have a firm chair that has side arms. You can use this for support while you get dressed.  Do not have throw rugs and other things on the floor that can make you trip. What can I do in the kitchen?  Clean up any spills right away.  Avoid walking on wet floors.  Keep items that you use a lot in easy-to-reach places.  If you need to reach something above you, use a strong step stool that has a grab bar.  Keep electrical cords out of the way.  Do not use floor polish or wax that makes floors slippery. If you must use wax, use non-skid floor wax.  Do not have throw rugs and other things on the floor that can make you trip. What can I do with my stairs?  Do not leave any items on the stairs.  Make sure that there are handrails on both sides of the stairs and use them. Fix handrails that are broken or loose. Make sure that handrails are as long as the stairways.  Check any carpeting to make sure that it is firmly attached to the stairs. Fix any carpet that is loose or worn.  Avoid having throw rugs at the top or bottom of the stairs. If you do have throw rugs, attach them to the floor with carpet tape.  Make sure that you have a light switch at the top of the stairs and the bottom of the stairs. If you do not have them, ask someone to add them for you. What else can I do to help prevent falls?  Wear shoes that:  Do not have high heels.  Have rubber bottoms.  Are comfortable and fit you well.  Are closed at the toe. Do not wear sandals.  If you  use a stepladder:  Make sure that it is fully opened. Do not climb a closed stepladder.  Make sure that both sides of the stepladder are locked into place.  Ask someone to hold it for you, if possible.  Clearly mark and make sure that you can see:  Any grab bars or handrails.  First and last steps.  Where the edge of each step is.  Use tools that help you move around (mobility aids) if they are needed. These include:  Canes.  Walkers.  Scooters.  Crutches.  Turn on the lights when you go into a dark area. Replace any light bulbs as soon as they burn out.  Set up your furniture so you have a clear path. Avoid moving your furniture around.  If any of your floors are uneven, fix them.  If there are any pets around you, be aware of where they are.  Review your medicines with your doctor. Some medicines can make you feel dizzy. This can increase your chance of falling. Ask your doctor what other things that you can do to help prevent falls. This information is not intended to replace advice given to you by your health care provider. Make sure you discuss any questions you have with your health care provider. Document Released: 01/01/2009 Document Revised: 08/13/2015 Document Reviewed: 04/11/2014 Elsevier Interactive Patient Education  2017 [employer].

## 2017-12-29 NOTE — Progress Notes (Signed)
Subjective:   Terri Bird is a 84 y.o. female who presents for Medicare Annual (Subsequent) preventive examination.  Review of Systems:  N/A  Cardiac Risk Factors include: advanced age (>18men, >69 women);dyslipidemia;hypertension     Objective:     Vitals: BP (!) 144/68 (BP Location: Right Arm)   Pulse 96   Temp 98 F (36.7 C) (Oral)   Ht 5' (1.524 m)   Wt 117 lb (53.1 kg)   BMI 22.85 kg/m   Body mass index is 22.85 kg/m.  Advanced Directives 12/29/2017 12/13/2016  Does Patient Have a Medical Advance Directive? No No  Would patient like information on creating a medical advance directive? No - Patient declined -    Tobacco Social History   Tobacco Use  Smoking Status Never Smoker  Smokeless Tobacco Never Used     Counseling given: Not Answered   Clinical Intake:  Pre-visit preparation completed: Yes  Pain : 0-10 Pain Score: 9  Pain Type: Chronic pain Pain Location: Abdomen Pain Orientation: Upper, Mid Pain Descriptors / Indicators: Nagging Pain Frequency: Constant     Nutritional Status: BMI of 19-24  Normal Nutritional Risks: None Diabetes: No  How often do you need to have someone help you when you read instructions, pamphlets, or other written materials from your doctor or pharmacy?: 5 - Always  Interpreter Needed?: No  Information entered by :: [facility], LPN  Past Medical History:  Diagnosis Date  . Aneurysm, thoracic aortic ([place])   . CAD in native artery 08/14/2007   followed by cardiology every six months.   . Cirrhosis ([place])   . Diverticulosis   . Gastric ulcer   . History of DVT (deep vein thrombosis)   . Hyperlipidemia   . Hypertension   . IBS (irritable bowel syndrome)    Past Surgical History:  Procedure Laterality Date  . CHOLECYSTECTOMY  1987  . ESOPHAGOGASTRODUODENOSCOPY  05/2009   Normal; [facility]  . Parathyroid Adenoma Resection     [facility]  . UPPER GASTROINTESTINAL ENDOSCOPY  02/2009   Mild HH, Mild  GERD  . US CAROTID DOPPLER BILATERAL ([place] [facility]X)  05/2009   minimal plaque formation. Symptom: near syncope   Family History  Problem Relation Age of Onset  . Stomach cancer Mother   . Stroke Father    Social History   Socioeconomic History  . Marital status: Widowed    Spouse name: Not on file  . Number of children: 5  . Years of education: Not on file  . Highest education level: High school graduate  Occupational History  . 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 Financial resource strain: Not hard at all  . Food insecurity:    Worry: Never true    Inability: Never true  . Transportation needs:    Medical: No    Non-medical: No  Tobacco Use  . Smoking status: Never Smoker  . Smokeless tobacco: Never Used  Substance and Sexual Activity  . Alcohol use: No    Alcohol/week: 0.0 standard drinks  . Drug use: No  . Sexual activity: Not on file  Lifestyle  . Physical activity:    Days per week: Not on file    Minutes per session: Not on file  . Stress: Not at all  Relationships  . Social connections:    Talks on phone: Patient refused    Gets together: Patient refused    Attends religious service:  Patient refused    Active member of club or organization: Patient refused    Attends meetings of clubs or organizations: Patient refused    Relationship status: Patient refused  Other Topics Concern  . Not on file  Social History Narrative  . Not on file    Outpatient Encounter Medications as of 12/29/2017  Medication Sig  . amLODipine (NORVASC) 5 MG tablet TAKE ONE (1) TABLET EACH DAY  . aspirin 81 MG tablet Take 81 mg by mouth daily.  Marland Kitchen atorvastatin (LIPITOR) 20 MG tablet TAKE ONE (1) TABLET EACH DAY  . brimonidine (ALPHAGAN) 0.2 % ophthalmic solution Apply 1 drop to eye 2 (two) times daily.  Marland Kitchen dicyclomine (BENTYL) 10 MG capsule Take 10 mg by mouth every 6 (six) hours as needed (abdominal pain).  . dorzolamide-timolol  (COSOPT) 22.3-6.8 MG/ML ophthalmic solution Place 1 drop into both eyes 2 (two) times daily.  Marland Kitchen lactulose (CHRONULAC) 10 GM/15ML solution Take 15 mLs (10 g total) by mouth 2 (two) times daily as needed for mild constipation.  Marland Kitchen latanoprost (XALATAN) 0.005 % ophthalmic solution Place 1 drop into both eyes at bedtime.  Marland Kitchen lisinopril (PRINIVIL,ZESTRIL) 20 MG tablet TAKE ONE (1) TABLET EACH DAY  . metoprolol (LOPRESSOR) 100 MG tablet TAKE ONE TABLET TWICE DAILY  . nitroGLYCERIN (NITROSTAT) 0.4 MG SL tablet 0.4 mg.  . rifaximin (XIFAXAN) 550 MG TABS tablet Take 550 mg by mouth 2 (two) times daily.  . traMADol-acetaminophen (ULTRACET) 37.5-325 MG tablet Take 1 tablet by mouth every 8 (eight) hours as needed.   . triamcinolone lotion (KENALOG) 0.1 % Apply 1 application topically 2 (two) times daily.   No facility-administered encounter medications on file as of 12/29/2017.     Activities of Daily Living In your present state of health, do you have any difficulty performing the following activities: 12/29/2017  Hearing? Y  Comment Does not wear hearing aids.   Vision? Y  Comment Is seen at [facility] for yearly checks. Needs an updated prescription.   Difficulty concentrating or making decisions? Y  Walking or climbing stairs? Y  Comment Unsteady gait.   Dressing or bathing? N  Doing errands, shopping? Y  Comment Does not drive.  Preparing Food and eating ? N  Using the Toilet? N  In the past six months, have you accidently leaked urine? N  Do you have problems with loss of bowel control? N  Managing your Medications? Y  Comment Son manages.   Managing your Finances? Y  Comment Son manages.   Housekeeping or managing your Housekeeping? Y  Comment Son cleans.   Some recent data might be hidden    Patient Care Team: [name], MD as PCP - General (Family Medicine) Yolonda Kida, MD as Consulting Physician (Cardiology) Lucky Cowboy Erskine Squibb, MD as Referring Physician (Vascular  Surgery) Ok Edwards, NP as Nurse Practitioner (Gastroenterology)    Assessment:   This is a routine wellness examination for Sharra.  Exercise Activities and Dietary recommendations Current Exercise Habits: The patient does not participate in regular exercise at present, Exercise limited by: orthopedic condition(s)  Goals    . Increase water intake     Recommend increasing water intake to 4 glasses a day.     . Prevent falls     Recommend to remove any items from the home that may cause slips or trips.        Fall Risk Fall Risk  12/29/2017 12/13/2016  Falls in the past year? No No  FALL RISK PREVENTION PERTAINING TO THE HOME:  Any stairs in or around the home WITH handrails? No  Home free of loose throw rugs in walkways, pet beds, electrical cords, etc? Yes  Adequate lighting in your home to reduce risk of falls? Yes   ASSISTIVE DEVICES UTILIZED TO PREVENT FALLS:  Life alert? No  Use of a cane, walker or w/c? Yes  Grab bars in the bathroom? No  Shower chair or bench in shower? Yes  Elevated toilet seat or a handicapped toilet? No   DME ORDERS:  DME order needed?  No   TIMED UP AND GO:  Was the test performed? No .    Depression Screen PHQ 2/9 Scores 12/29/2017 12/13/2016  PHQ - 2 Score 0 2  PHQ- 9 Score - 3     Cognitive Function: Declined screening today.         Immunization History  Administered Date(s) Administered  . Influenza, High Dose Seasonal PF 01/22/2016, 12/13/2016  . Pneumococcal Conjugate-13 12/05/2013    Qualifies for Shingles Vaccine? Yes  Due for Shingrix. Education has been provided regarding the importance of this vaccine. Pt has been advised to call insurance company to determine out of pocket expense. Advised may also receive vaccine at local pharmacy or Health Dept. Verbalized acceptance and understanding.  Tdap: Although this vaccine is not a covered service during a Wellness Exam, does the patient still wish to  receive this vaccine today?  No .  Education has been provided regarding the importance of this vaccine. Advised may receive this vaccine at local pharmacy or Health Dept. Aware to provide a copy of the vaccination record if obtained from local pharmacy or Health Dept. Verbalized acceptance and understanding.  Flu Vaccine: Due for Flu vaccine. Does the patient want to receive this vaccine today?  No . Education has been provided regarding the importance of this vaccine but still declined. Advised may receive this vaccine at local pharmacy or Health Dept. Aware to provide a copy of the vaccination record if obtained from local pharmacy or Health Dept. Verbalized acceptance and understanding.  Pneumococcal Vaccine: Due for Pneumococcal vaccine. Does the patient want to receive this vaccine today?  No . Education has been provided regarding the importance of this vaccine but still declined. Advised may receive this vaccine at local pharmacy or Health Dept. Aware to provide a copy of the vaccination record if obtained from local pharmacy or Health Dept. Verbalized acceptance and understanding.   Screening Tests Health Maintenance  Topic Date Due  . INFLUENZA VACCINE  06/19/2018 (Originally 10/19/2017)  . DEXA SCAN  03/21/2026 (Originally 05/19/1999)  . TETANUS/TDAP  03/21/2026 (Originally 05/18/1951)  . PNA vac Low Risk Adult (2 of 2 - PPSV23) 03/21/2026 (Originally 12/06/2014)   Cancer Screenings:  Colorectal Screening: No longer required.  Mammogram: No longer required.  Bone Density: Ordered today. Pt provided with contact info and advised to call to schedule appt. Pt aware the office will call re: appt.  Lung Cancer Screening: (Low Dose CT Chest recommended if Age 51-80 years, 30 pack-year currently smoking OR have quit w/in 15years.) does not qualify.   Additional Screening:  Hepatitis C Screening: does not qualify.  Vision Screening: Recommended annual ophthalmology exams for early  detection of glaucoma and other disorders of the eye.  Dental Screening: Recommended annual dental exams for proper oral hygiene  Community Resource Referral:  CRR required this visit?  No      Plan:  I have personally reviewed and  addressed the Medicare Annual Wellness questionnaire and have noted the following in the patient's chart:  A. Medical and social history B. Use of alcohol, tobacco or illicit drugs  C. Current medications and supplements D. Functional ability and status E.  Nutritional status F.  Physical activity G. Advance directives H. List of other physicians I.  Hospitalizations, surgeries, and ER visits in previous 12 months J.  [place] such as hearing and vision if needed, cognitive and depression L. Referrals and appointments - none  In addition, I have reviewed and discussed with patient certain preventive protocols, quality metrics, and best practice recommendations. A written personalized care plan for preventive services as well as general preventive health recommendations were provided to patient.  See attached scanned questionnaire for additional information.   Signed,  Fabio Neighbors, LPN Nurse Health Advisor   Nurse Recommendations: Pt declined the influenza, tetanus and Pneumovax 23 vaccines today.

## 2018-01-19 ENCOUNTER — Ambulatory Visit (INDEPENDENT_AMBULATORY_CARE_PROVIDER_SITE_OTHER): Payer: Medicare Other | Admitting: Family Medicine

## 2018-01-19 ENCOUNTER — Encounter: Payer: Self-pay | Admitting: Family Medicine

## 2018-01-19 VITALS — BP 132/70 | HR 88 | Temp 97.9°F | Resp 16 | Ht 60.0 in | Wt 117.0 lb

## 2018-01-19 DIAGNOSIS — I10 Essential (primary) hypertension: Secondary | ICD-10-CM

## 2018-01-19 DIAGNOSIS — K7460 Unspecified cirrhosis of liver: Secondary | ICD-10-CM

## 2018-01-19 DIAGNOSIS — L309 Dermatitis, unspecified: Secondary | ICD-10-CM | POA: Diagnosis not present

## 2018-01-19 DIAGNOSIS — R1013 Epigastric pain: Secondary | ICD-10-CM

## 2018-01-19 MED ORDER — DICYCLOMINE HCL 10 MG PO CAPS: 10.0000 mg | ORAL_CAPSULE | Freq: Four times a day (QID) | ORAL | 2 refills | Status: DC | PRN

## 2018-01-19 MED ORDER — TRIAMCINOLONE ACETONIDE 0.1 % EX LOTN: 1.0000 "application " | TOPICAL_LOTION | Freq: Two times a day (BID) | CUTANEOUS | 3 refills | Status: DC

## 2018-01-19 NOTE — Progress Notes (Signed)
Patient: Terri Bird, Female    DOB: 1932-05-31, 84 y.o.   MRN: [id number] Visit Date: 01/19/2018  Today's Provider: Lelon Huh, MD   Chief Complaint  Patient presents with  . Follow-up   Subjective:  Patient saw Alyson Ingles for AWE on 12/29/2017.     Hypertension, follow-up:  BP Readings from Last 3 Encounters:  01/19/18 132/70  12/29/17 (!) 144/68  02/01/17 (!) 159/74    She was last seen for hypertension 1 years ago.  BP at that visit was 159/74. Management since that visit includes no changes. She reports good compliance with treatment. She is not having side effects.  She is not exercising. She is adherent to low salt diet.   Outside blood pressures are checked occasionally. She is experiencing none.  Patient denies exertional chest pressure/discomfort, lower extremity edema and palpitations.   Cardiovascular risk factors include dyslipidemia.  Use of agents associated with hypertension: none.     Weight trend: stable Wt Readings from Last 3 Encounters:  01/19/18 117 lb (53.1 kg)  12/29/17 117 lb (53.1 kg)  02/01/17 135 lb (61.2 kg)    Current diet: well balanced  Patient is also requesting that she have triamcinolone cream sent into the pharmacy. She reports that this helps with dry skin and uses intermittently.   She continues to have nearly daily chronic abdominal pain for which she has extensive evaluation and is followed at [facility]. She states that she is out of dicyclomine which seemed to be effective and requests refill.   Review of Systems  Constitutional: Negative.   HENT: Negative.   Eyes: Negative.   Respiratory: Negative.   Cardiovascular: Negative.   Gastrointestinal: Negative.   Endocrine: Negative.   Genitourinary: Negative.   Musculoskeletal: Negative.   Skin: Negative.   Allergic/Immunologic: Positive for environmental allergies.  Neurological: Negative.   Hematological: Negative.   Psychiatric/Behavioral: Negative.      Social History   Socioeconomic History  . Marital status: Widowed    Spouse name: Not on file  . Number of children: 5  . Years of education: Not on file  . Highest education level: High school graduate  Occupational History  . 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 Financial resource strain: Not hard at all  . Food insecurity:    Worry: Never true    Inability: Never true  . Transportation needs:    Medical: No    Non-medical: No  Tobacco Use  . Smoking status: Never Smoker  . Smokeless tobacco: Never Used  Substance and Sexual Activity  . Alcohol use: No    Alcohol/week: 0.0 standard drinks  . Drug use: No  . Sexual activity: Not on file  Lifestyle  . Physical activity:    Days per week: Not on file    Minutes per session: Not on file  . Stress: Not at all  Relationships  . Social connections:    Talks on phone: Patient refused    Gets together: Patient refused    Attends religious service: Patient refused    Active member of club or organization: Patient refused    Attends meetings of clubs or organizations: Patient refused    Relationship status: Patient refused  . Intimate partner violence:    Fear of current or ex partner: Patient refused    Emotionally abused: Patient refused    Physically abused: Patient refused    Forced sexual activity:  Patient refused  Other Topics Concern  . Not on file  Social History Narrative  . Not on file    Past Medical History:  Diagnosis Date  . Aneurysm, thoracic aortic ([place])   . CAD in native artery 08/14/2007   followed by cardiology every six months.   . Cirrhosis ([place])   . Diverticulosis   . Gastric ulcer   . History of DVT (deep vein thrombosis)   . Hyperlipidemia   . Hypertension   . IBS (irritable bowel syndrome)      Patient Active Problem List   Diagnosis Date Noted  . Acanthosis nigricans 01/22/2016  . 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 Back pain  10/29/2014  . Hepatic cirrhosis ([place]) 10/29/2014  . History of DVT of lower extremity 10/29/2014  . Pancreatitis 10/29/2014  . Thoracic aortic aneurysm ([place]) 10/29/2014  . Lumbago 10/21/2010  . Diaphragmatic hernia without obstruction or gangrene 03/06/2009  . Irritable bowel syndrome 03/06/2009  . Diverticulosis of colon without hemorrhage 03/02/2009  . Chronic gastric ulcer 03/02/2009  . Pulmonary embolism and infarction ([place]) 08/14/2008  . Abdominal pain, epigastric 08/03/2008  . Pure hypercholesterolemia 09/19/2007  . CAD in native artery 08/14/2007  . Constipation 12/22/2006  . Glaucoma 12/22/2006  . Essential hypertension 12/22/2006    Past Surgical History:  Procedure Laterality Date  . CHOLECYSTECTOMY  1987  . ESOPHAGOGASTRODUODENOSCOPY  05/2009   Normal; [facility]  . Parathyroid Adenoma Resection     [facility]  . UPPER GASTROINTESTINAL ENDOSCOPY  02/2009   Mild HH, Mild GERD  . US CAROTID DOPPLER BILATERAL ([place] HX)  05/2009   minimal plaque formation. Symptom: near syncope    Her family history includes Stomach cancer in her mother; Stroke in her father.      Current Outpatient Medications:  .  amLODipine (NORVASC) 5 MG tablet, TAKE ONE (1) TABLET EACH DAY, Disp: 30 tablet, Rfl: 11 .  aspirin 81 MG tablet, Take 81 mg by mouth daily., Disp: , Rfl:  .  atorvastatin (LIPITOR) 20 MG tablet, TAKE ONE (1) TABLET EACH DAY, Disp: 90 tablet, Rfl: 4 .  brimonidine (ALPHAGAN) 0.2 % ophthalmic solution, Apply 1 drop to eye 2 (two) times daily., Disp: , Rfl:  .  dicyclomine (BENTYL) 10 MG capsule, Take 1 capsule (10 mg total) by mouth every 6 (six) hours as needed (abdominal pain)., Disp: 60 capsule, Rfl: 2 (NOT TAKING) .  dorzolamide-timolol (COSOPT) 22.3-6.8 MG/ML ophthalmic solution, Place 1 drop into both eyes 2 (two) times daily., Disp: , Rfl:  .  lactulose (CHRONULAC) 10 GM/15ML solution, Take 15 mLs (10 g total) by mouth 2 (two) times daily as needed for mild  constipation., Disp: 900 mL, Rfl: 3 .  latanoprost (XALATAN) 0.005 % ophthalmic solution, Place 1 drop into both eyes at bedtime., Disp: , Rfl:  .  lisinopril (PRINIVIL,ZESTRIL) 20 MG tablet, TAKE ONE (1) TABLET EACH DAY, Disp: 90 tablet, Rfl: 3 .  metoprolol (LOPRESSOR) 100 MG tablet, TAKE ONE TABLET TWICE DAILY, Disp: 60 tablet, Rfl: 11 .  nitroGLYCERIN (NITROSTAT) 0.4 MG SL tablet, 0.4 mg., Disp: , Rfl:  .  pantoprazole (PROTONIX) 40 MG tablet, Take 40 mg by mouth daily., Disp: , Rfl:  .  rifaximin (XIFAXAN) 550 MG TABS tablet, Take 550 mg by mouth 2 (two) times daily., Disp: , Rfl:  .  traMADol-acetaminophen (ULTRACET) 37.5-325 MG tablet, Take 1 tablet by mouth every 8 (eight) hours as needed. , Disp: , Rfl:  .  triamcinolone lotion (  KENALOG) 0.1 %, Apply 1 application topically 2 (two) times daily., Disp: 60 mL, Rfl: 3  Patient Care Team: [name], MD as PCP - General (Family Medicine) Yolonda Kida, MD as Consulting Physician (Cardiology) Algernon Huxley, MD as Referring Physician (Vascular Surgery) Ok Edwards, NP as Nurse Practitioner (Gastroenterology)     Objective:   Vitals: BP 132/70 (BP Location: Right Arm, Patient Position: Sitting, Cuff Size: Normal)   Pulse 88   Temp 97.9 F (36.6 C)   Resp 16   Ht 5' (1.524 m)   Wt 117 lb (53.1 kg)   SpO2 99%   BMI 22.85 kg/m   Physical Exam   General Appearance:    Alert, cooperative, no distress  Eyes:    PERRL, conjunctiva/corneas clear, EOM's intact       Lungs:     Clear to auscultation bilaterally, respirations unlabored  Heart:    Regular rate and rhythm  Neurologic:   Awake, alert, oriented x 3. No apparent focal neurological           defect.        Activities of Daily Living In your present state of health, do you have any difficulty performing the following activities: 12/29/2017  Hearing? Y  Comment Does not wear hearing aids.   Vision? Y  Comment Is seen at [facility] for yearly checks.  Needs an updated prescription.   Difficulty concentrating or making decisions? Y  Walking or climbing stairs? Y  Comment Unsteady gait.   Dressing or bathing? N  Doing errands, shopping? Y  Comment Does not drive.  Preparing Food and eating ? N  Using the Toilet? N  In the past six months, have you accidently leaked urine? N  Do you have problems with loss of bowel control? N  Managing your Medications? Y  Comment Son manages.   Managing your Finances? Y  Comment Son manages.   Housekeeping or managing your Housekeeping? Y  Comment Son cleans.   Some recent data might be hidden    Fall Risk Assessment Fall Risk  12/29/2017 12/13/2016  Falls in the past year? No No     Depression Screen PHQ 2/9 Scores 12/29/2017 12/13/2016  PHQ - 2 Score 0 2  PHQ- 9 Score - 3    Cognitive Testing - 6-CIT No flowsheet data found. Patient declined on 12/29/2017.       Assessment & Plan:     Annual Wellness Visit  Reviewed patient's Family Medical History Reviewed and updated list of patient's medical providers Assessment of cognitive impairment was done Assessed patient's functional ability Established a written schedule for health screening [place] Completed and Reviewed  Exercise Activities and Dietary recommendations Goals    . Increase water intake     Recommend increasing water intake to 4 glasses a day.     . Prevent falls     Recommend to remove any items from the home that may cause slips or trips.        Immunization History  Administered Date(s) Administered  . Influenza, High Dose Seasonal PF 01/22/2016, 12/13/2016  . Pneumococcal Conjugate-13 12/05/2013    Health Maintenance  Topic Date Due  . INFLUENZA VACCINE  06/19/2018 (Originally 10/19/2017)  . DEXA SCAN  03/21/2026 (Originally 05/19/1999)  . TETANUS/TDAP  03/21/2026 (Originally 05/18/1953)  . PNA vac Low Risk Adult (2 of 2 - PPSV23) 03/21/2026 (Originally 12/06/2014)      Discussed health benefits of physical activity, and  encouraged her to engage in regular exercise appropriate for her age and condition.    ------------------------------------------------------------------------------------------------------------  1. Eczema, unspecified type refill- triamcinolone lotion (KENALOG) 0.1 %; Apply 1 application topically 2 (two) times daily.  Dispense: 60 mL; Refill: 3 - Comprehensive metabolic panel  2. Essential hypertension Well controlled.  Continue current medications.   - Comprehensive metabolic panel  3. Cirrhosis of liver without ascites, unspecified hepatic cirrhosis type ([place]) Stable, continues to be followed at [facility]. GI - Comprehensive metabolic panel  4. Abdominal pain, epigastric Chronic and stable. Continue current medications.  Refill dicyclomine which she states has been helpful in the past.     Lelon Huh, MD  [place]

## 2018-01-20 LAB — COMPREHENSIVE METABOLIC PANEL
ALBUMIN: 3.7 g/dL (ref 3.5–4.7)
ALK PHOS: 133 IU/L — AB (ref 39–117)
ALT: 10 IU/L (ref 0–32)
AST: 23 IU/L (ref 0–40)
Albumin/Globulin Ratio: 1.1 — ABNORMAL LOW (ref 1.2–2.2)
BILIRUBIN TOTAL: 1.1 mg/dL (ref 0.0–1.2)
BUN/Creatinine Ratio: 11 — ABNORMAL LOW (ref 12–28)
BUN: 12 mg/dL (ref 8–27)
CHLORIDE: 111 mmol/L — AB (ref 96–106)
CO2: 17 mmol/L — AB (ref 20–29)
Calcium: 9.5 mg/dL (ref 8.7–10.3)
Creatinine, Ser: 1.06 mg/dL — ABNORMAL HIGH (ref 0.57–1.00)
GFR calc Af Amer: 56 mL/min/{1.73_m2} — ABNORMAL LOW (ref >59)
GFR calc non Af Amer: 49 mL/min/{1.73_m2} — ABNORMAL LOW (ref >59)
GLUCOSE: 92 mg/dL (ref 65–99)
Globulin, Total: 3.5 g/dL (ref 1.5–4.5)
Potassium: 3.9 mmol/L (ref 3.5–5.2)
SODIUM: 142 mmol/L (ref 134–144)
Total Protein: 7.2 g/dL (ref 6.0–8.5)

## 2018-01-22 ENCOUNTER — Telehealth: Payer: Self-pay

## 2018-01-22 NOTE — Telephone Encounter (Signed)
Patient was advised.  

## 2018-01-22 NOTE — Telephone Encounter (Signed)
-----   Message from [name], MD sent at 01/21/2018  8:41 PM EST ----- Is a little dehydrated. Need to drink more water. Otherwise labs normal. Continue current medications.

## 2018-01-30 ENCOUNTER — Telehealth: Payer: Self-pay | Admitting: Urology

## 2018-01-30 DIAGNOSIS — N2889 Other specified disorders of kidney and ureter: Secondary | ICD-10-CM

## 2018-01-30 NOTE — Telephone Encounter (Signed)
Patient had a 1 year follow up with a CT scan prior to appointment with Dr. Erlene Quan tomorrow.  Patient has not had a CT scan and appointment has been cancelled.  Please follow up with the radiology department and get CT scan and follow up appointment scheduled.

## 2018-01-31 ENCOUNTER — Ambulatory Visit: Payer: Medicare Other | Admitting: Urology

## 2018-02-01 ENCOUNTER — Ambulatory Visit: Payer: Medicare Other | Admitting: Urology

## 2018-02-01 NOTE — Telephone Encounter (Signed)
Message sent to Manuela Schwartz in scheduling to try and get her on the schedule. Will reschedule her follow up   [facility]

## 2018-02-02 NOTE — Telephone Encounter (Signed)
Looks like her CT scan has expired  Can you put in a new order so we can get her scheduled please?   Thanks, Sharyn Lull

## 2018-02-05 ENCOUNTER — Encounter: Payer: Self-pay | Admitting: Family Medicine

## 2018-02-05 ENCOUNTER — Telehealth: Payer: Self-pay

## 2018-02-05 ENCOUNTER — Ambulatory Visit
Admission: RE | Admit: 2018-02-05 | Discharge: 2018-02-05 | Disposition: A | Discharge status: Home / Self Care | Payer: Medicare Other | Source: Ambulatory Visit | Attending: Family Medicine | Admitting: Family Medicine

## 2018-02-05 DIAGNOSIS — E2839 Other primary ovarian failure: Secondary | ICD-10-CM | POA: Insufficient documentation

## 2018-02-05 DIAGNOSIS — Z780 Asymptomatic menopausal state: Secondary | ICD-10-CM | POA: Diagnosis not present

## 2018-02-05 DIAGNOSIS — M810 Age-related osteoporosis without current pathological fracture: Secondary | ICD-10-CM | POA: Insufficient documentation

## 2018-02-05 NOTE — Telephone Encounter (Signed)
Tried calling; pt did not seem to understand the results of her Bone Density.  She also could not verify the pharmacy she uses.  Pt states her son Mallie Mussel stays with her, but he is not home.  I will try back again later.   Thanks,   -Mickel Baas

## 2018-02-05 NOTE — Telephone Encounter (Signed)
-----   Message from [name], MD sent at 02/05/2018  1:02 PM EST ----- Bone density shows osteoporosis. Need to start alendronate 70mg  once a week. #4 rf x 12

## 2018-02-19 NOTE — Telephone Encounter (Signed)
I had to cancel her app again We have tried reaching out to her and both her sons and she is very confused, neither one of her sons would call us back to schedule her CT scan or discuss her appts. Not sure where to go at this point. The contact number says her son but when I call it she answers. The other number says her other son but never get an answer.   Sharyn Lull

## 2018-02-19 NOTE — Telephone Encounter (Signed)
Please send a letter indicating that you have tried to reach out to her.  Please have her contact her office when she gets a letter to reschedule her CT scan.  Hollice Espy, MD

## 2018-02-20 ENCOUNTER — Ambulatory Visit: Payer: Medicare Other | Admitting: Urology

## 2018-02-28 ENCOUNTER — Ambulatory Visit
Admission: RE | Admit: 2018-02-28 | Discharge: 2018-02-28 | Disposition: A | Discharge status: Home / Self Care | Payer: Medicare Other | Source: Ambulatory Visit | Attending: Urology | Admitting: Urology

## 2018-02-28 DIAGNOSIS — N2889 Other specified disorders of kidney and ureter: Secondary | ICD-10-CM | POA: Diagnosis not present

## 2018-02-28 MED ORDER — IOPAMIDOL (ISOVUE-370) INJECTION 76%
60.0000 mL | Freq: Once | INTRAVENOUS | Status: AC | PRN
  Administered 2018-02-28: 60 mL via INTRAVENOUS

## 2018-03-06 ENCOUNTER — Encounter: Payer: Self-pay | Admitting: Urology

## 2018-03-07 NOTE — Telephone Encounter (Signed)
Pt's son Scott Fix is calling regarding all of pt recent results of pt's testing.  Please call son back asap at [phone]  Thanks, [place]

## 2018-03-08 NOTE — Telephone Encounter (Signed)
Spoke with Mr. Stonehouse.  He reports that Ms Myrtie Neither has past away so he has been handling Ms. Albarracin's medications and appointments.  He is going to come by later today and fill out a new DPR.    Thanks,   -Mickel Baas

## 2018-03-08 NOTE — Telephone Encounter (Signed)
LMTCB 03/08/2018.  FYI.Marland KitchenMarland KitchenHonestee Revard is NOT on the DPR.  Only her daughter Charlyn Minerva.     Thanks,   -Mickel Baas

## 2018-03-27 ENCOUNTER — Telehealth: Payer: Self-pay

## 2018-03-27 NOTE — Telephone Encounter (Signed)
I don't think you ordered a CT for pt.  Maybe Urology?    Thanks,   -Mickel Baas

## 2018-03-27 NOTE — Telephone Encounter (Signed)
Patients son Legrand Como called office requesting for CMA to give him a call back in regards to patients CAT scan results. KW

## 2018-03-27 NOTE — Telephone Encounter (Signed)
They need to contact dr. Cherrie Gauze office.

## 2018-03-28 NOTE — Telephone Encounter (Signed)
Patients son advised

## 2018-03-29 ENCOUNTER — Telehealth: Payer: Self-pay | Admitting: Urology

## 2018-04-03 ENCOUNTER — Encounter: Payer: Self-pay | Admitting: Urology

## 2018-04-03 ENCOUNTER — Ambulatory Visit (INDEPENDENT_AMBULATORY_CARE_PROVIDER_SITE_OTHER): Payer: Medicare Other | Admitting: Urology

## 2018-04-03 VITALS — BP 158/82 | HR 93 | Ht 60.0 in | Wt 120.0 lb

## 2018-04-03 DIAGNOSIS — I77819 Aortic ectasia, unspecified site: Secondary | ICD-10-CM | POA: Diagnosis not present

## 2018-04-03 DIAGNOSIS — K5900 Constipation, unspecified: Secondary | ICD-10-CM

## 2018-04-03 DIAGNOSIS — N281 Cyst of kidney, acquired: Secondary | ICD-10-CM

## 2018-04-03 DIAGNOSIS — N63 Unspecified lump in unspecified breast: Secondary | ICD-10-CM

## 2018-04-03 DIAGNOSIS — R911 Solitary pulmonary nodule: Secondary | ICD-10-CM

## 2018-04-03 NOTE — Progress Notes (Signed)
04/03/2018  12:22 PM   Terri Piedra Leonie Green 11-28-1932 [id number]  Referring provider: [name], MD [address] [place], [place] [address]  Chief Complaint  Patient presents with  . Renal Mass    1 yr follow up    HPI: Terri Bird is a 85 y.o. female with a known right upper pole renal mass who returns today, with husband and son, for annual follow up.  Patient denies any urinary symptoms at this time.  No flank pain, weight loss, or gross hematuria.  She does mention today that she struggles with constipation.  She drinks plenty of water and eats lots of fruits and vegetables.  Is not taking any medications for this.   CT reveals stable 3.4 x 3.3 cm right upper pole cystic renal lesion, stable from previous MRI imaging 1 year ago.  Additional non-urologic findings include possible left breast mass, pulmonary nodule, and aortic dilation.  Previous history as below: She was previously followed by Dr. Bernardo Heater last seen in 2015 for the same lesion. In the interim, with interval size increase 2.4 x 1.6 cm now measuring 3.6 x 3.3 cm in 06/2016 on MR. the lesion was initially classified as a Bosniak 2 lesion, but follow up imaging was concerning for possible Bosniak 3 lesion although the image quality was degraded by motion artifact.  F/u MRI showed a stable right-sided lesion without interval increase in size.  Findings are most consistent with Bosniak 2 lesion as of 01/2017.   PMH: Past Medical History:  Diagnosis Date  . Aneurysm, thoracic aortic ([place])   . CAD in native artery 08/14/2007   followed by cardiology every six months.   . Cirrhosis ([place])   . Diverticulosis   . Gastric ulcer   . History of DVT (deep vein thrombosis)   . Hyperlipidemia   . Hypertension   . IBS (irritable bowel syndrome)     Surgical History: Past Surgical History:  Procedure Laterality Date  . CHOLECYSTECTOMY  1987  . ESOPHAGOGASTRODUODENOSCOPY  05/2009   Normal; [facility]  .  Parathyroid Adenoma Resection     [facility]  . UPPER GASTROINTESTINAL ENDOSCOPY  02/2009   Mild HH, Mild GERD  . US CAROTID DOPPLER BILATERAL ([place] [facility])  05/2009   minimal plaque formation. Symptom: near syncope    Home Medications:  Allergies as of 04/03/2018   No Known Allergies     Medication List       Accurate as of April 03, 2018 12:22 PM. Always use your most recent med list.        amLODipine 5 MG tablet Commonly known as:  NORVASC TAKE ONE (1) TABLET EACH DAY   aspirin 81 MG tablet Take 81 mg by mouth daily.   atorvastatin 20 MG tablet Commonly known as:  LIPITOR TAKE ONE (1) TABLET EACH DAY   brimonidine 0.2 % ophthalmic solution Commonly known as:  ALPHAGAN Apply 1 drop to eye 2 (two) times daily.   dicyclomine 10 MG capsule Commonly known as:  BENTYL Take 1 capsule (10 mg total) by mouth every 6 (six) hours as needed (abdominal pain).   dorzolamide-timolol 22.3-6.8 MG/ML ophthalmic solution Commonly known as:  COSOPT Place 1 drop into both eyes 2 (two) times daily.   lactulose 10 GM/15ML solution Commonly known as:  CHRONULAC Take 15 mLs (10 g total) by mouth 2 (two) times daily as needed for mild constipation.   latanoprost 0.005 % ophthalmic solution Commonly known as:  XALATAN Place 1 drop into both eyes at bedtime.   lisinopril 20 MG tablet Commonly known as:  PRINIVIL,ZESTRIL TAKE ONE (1) TABLET EACH DAY   metoprolol tartrate 100 MG tablet Commonly known as:  LOPRESSOR TAKE ONE TABLET TWICE DAILY   nitroGLYCERIN 0.4 MG SL tablet Commonly known as:  NITROSTAT 0.4 mg.   pantoprazole 40 MG tablet Commonly known as:  PROTONIX Take 40 mg by mouth daily.   rifaximin 550 MG Tabs tablet Commonly known as:  XIFAXAN Take 550 mg by mouth 2 (two) times daily.   traMADol-acetaminophen 37.5-325 MG tablet Commonly known as:  ULTRACET Take 1 tablet by mouth every 8 (eight) hours as needed.   triamcinolone lotion 0.1 % Commonly  known as:  KENALOG Apply 1 application topically 2 (two) times daily.       Allergies: No Known Allergies  Family History: Family History  Problem Relation Age of Onset  . Stomach cancer Mother   . Stroke Father     Social History:  reports that she has never smoked. She has never used smokeless tobacco. She reports that she does not drink alcohol or use drugs.  ROS: UROLOGY Frequent Urination?: No Hard to postpone urination?: No Burning/pain with urination?: No Get up at night to urinate?: No Leakage of urine?: No Urine stream starts and stops?: No Trouble starting stream?: No Do you have to strain to urinate?: No Blood in urine?: No Urinary tract infection?: No Sexually transmitted disease?: No Injury to kidneys or bladder?: No Painful intercourse?: No Weak stream?: No Currently pregnant?: No Vaginal bleeding?: No Last menstrual period?: n  Gastrointestinal Nausea?: No Vomiting?: No Indigestion/heartburn?: No Diarrhea?: No Constipation?: No  Constitutional Fever: No Night sweats?: No Weight loss?: No Fatigue?: No  Skin Skin rash/lesions?: No Itching?: No  Eyes Blurred vision?: No Double vision?: No  Ears/Nose/Throat Sore throat?: No Sinus problems?: No  Hematologic/Lymphatic Swollen glands?: No Easy bruising?: No  Cardiovascular Leg swelling?: No Chest pain?: No  Respiratory Cough?: No Shortness of breath?: No  Endocrine Excessive thirst?: No  Musculoskeletal Back pain?: No Joint pain?: No  Neurological Headaches?: No Dizziness?: No  Psychologic Depression?: No Anxiety?: No  Physical Exam: BP (!) 158/82 (BP Location: Left Arm, Patient Position: Sitting)   Pulse 93   Ht 5' (1.524 m)   Wt 120 lb (54.4 kg) Comment: per husband  BMI 23.44 kg/m   Constitutional:  Alert and oriented, No acute distress.  Wheelchair, committed by husband and son.  Elderly, frail appearing. Respiratory: Normal respiratory effort, no increased  work of breathing. GU: No CVA tenderness Skin: No rashes, bruises or suspicious lesions. Neurologic: Grossly intact, no focal deficits, moving all 4 extremities. Psychiatric: Normal mood and affect.  Laboratory Data: Lab Results  Component Value Date   CREATININE 1.06 (H) 01/19/2018   Pertinent Imaging: CLINICAL DATA:  Follow-up of renal mass. History of cirrhosis. Thoracic aneurysm.  EXAM: CT ABDOMEN WITHOUT AND WITH CONTRAST  TECHNIQUE: Multidetector CT imaging of the abdomen was performed following the standard protocol before and following the bolus administration of intravenous contrast.  CONTRAST:  18mL ISOVUE-370 IOPAMIDOL (ISOVUE-370) INJECTION 76%  COMPARISON:  01/31/2017 abdominal MRI.  Most recent CT 12/04/2013.  FINDINGS: Lower chest: Probable scarring along the right minor fissure, similar. A right lower lobe 4 mm nodule on image 13/9 is not readily apparent on 12/04/2013 comparison CT. Moderate cardiomegaly. Aortic and coronary artery atherosclerosis. Ascending aortic dilatation at 4.2 cm, 3.9 cm on 12/04/2013. Lower thoracic aortic stent graft  repair.  Hepatobiliary: Marked cirrhosis. No suspicious liver lesion. Cholecystectomy, without biliary ductal dilatation.  Pancreas: Normal, without mass or ductal dilatation.  Spleen: Normal in size, Normal in size, without focal abnormality.  Adrenals/Urinary Tract: Medial limb right adrenal gland nodularity is similar to 2015, favoring a benign etiology. The left adrenal is mildly thickened but maintains adreniform shape.  No abdominal urinary tract calculi or obstructive uropathy.  The upper pole right renal lesion of concern measures 3.3 x 3.4 cm on image 50/8. Compare 3.4 x 3.3 cm on the prior MRI (when remeasured).  There are multiple bilateral renal lesions, felt to represent cysts and complex cysts.  An interpolar left renal lesion medially measures 2.1 cm and demonstrates  heterogeneous enhancement centrally, including image 90/8. On the order of 2.1 cm today versus 1.6 cm on the prior exam (when remeasured).  Stomach/Bowel: Normal stomach, without wall thickening. Scattered colonic diverticula. Normal terminal ileum and appendix. Normal small bowel.  Vascular/Lymphatic: Celiac stent or stent graft. Advanced aortic and branch vessel atherosclerosis. Patent renal veins. Varices about the proximal stomach and less so distal esophagus. Patent portal and splenic veins. No retroperitoneal or retrocrural adenopathy.  Other: No ascites.  Musculoskeletal: Apparent soft tissue nodule in the lateral left breast at 2.8 cm on image 1/15. Not readily apparent on 12/04/2013. Osteopenia.  IMPRESSION: 1. Numerous bilateral renal lesions, primarily felt to represent cysts of varying complexity. The lesion of concern, in the upper pole right kidney, measures similar to on the prior. There is an interpolar left renal lesion which is suspicious for a cystic renal cell carcinoma. This is enlarged compared to 01/31/2017. 2. No evidence of abdominal metastatic disease. 3. Cirrhosis and portal venous hypertension. 4. Soft tissue fullness in the lateral left breast. Cannot exclude developing mass. Consider mammogram and possibly diagnostic ultrasound with attention to the inferolateral left breast. 5. Nonspecific 4 mm right lower lobe pulmonary nodule, likely new compared to 2015. 6. Ascending aortic dilatation, increased at 4.2 cm. Suboptimally evaluated. Recommend annual imaging followup by CTA or MRA. This recommendation follows 2010 ACCF/AHA/AATS/ACR/ASA/SCA/SCAI/SIR/STS/SVM Guidelines for the Diagnosis and Management of Patients with Thoracic Aortic Disease. Circulation. 2010; 121: S010-X323   Electronically Signed   By: Abigail Miyamoto M.D.   On: 02/28/2018 11:54  I have personally reviewed the images today.  [place] with radiologic  interpretation.  Assessment & Plan:   1. Renal Mass  - Bosniak 72F right upper pole renal cyst, stable.  - Most recent imaging (02/28/2018) remains the same urologically.  - Given her multiple medical comorbidities and lack of interval change as well as reassuring features, continue annual surveillance with CT (see below, kidneys will be imaged with surveillance of #3 as per radiological recommendations) to ensure stability  2. Left breast mass  - Noted on CT along with pulmonary nodule.   - Referred to oncology for evaluation  3. Aortic Dilation  -Per radiology recommendations, recommend CTA in 12 months, ordered and will assess kidney with the scan as well to limit number of imaging studies  4. Constipation, unspecified constipation type  - Discussed increasing fiber in diet             - Colace daily as needed  - Advised to discuss with PCP  Return in about 1 year (around 04/04/2019) for with CT prior.  Hollice Espy, MD  [facility] [address] [place], [place] [address] [phone]  I, Temidayo Atanda-Ogunleye , am acting as a Education administrator for  Hollice Espy, MD  I have reviewed the above documentation for accuracy and completeness, and I agree with the above.   Hollice Espy, MD

## 2018-04-06 NOTE — Telephone Encounter (Signed)
error 

## 2018-04-08 DIAGNOSIS — N6320 Unspecified lump in the left breast, unspecified quadrant: Secondary | ICD-10-CM | POA: Insufficient documentation

## 2018-04-08 NOTE — Progress Notes (Signed)
[place]  [phone] [phone]  ID: Windell Moment OB: Dec 22, 1932  MR#: [id number]  JJK#:[id number]  Patient Care Team: [name], MD as PCP - General (Family Medicine) Yolonda Kida, MD as Consulting Physician (Cardiology) Lucky Cowboy Erskine Squibb, MD as Referring Physician (Vascular Surgery) Ok Edwards, NP as Nurse Practitioner (Gastroenterology)  CHIEF COMPLAINT: Left breast mass.  INTERVAL HISTORY: Patient is an 85 year old female who underwent CT scan for follow-up for renal mass and was noted to have an incidental abnormality in her left breast.  She was also noted to have a 4 mm right lower lobe pulmonary nodule.  She currently feels well and is asymptomatic.  She has no neurologic complaints.  She denies any recent fevers or illnesses.  She has a good appetite and denies weight loss.  She has no chest pain or shortness of breath.  She denies any nausea, vomiting, constipation, or diarrhea.  She has no urinary complaints.  Patient feels that her baseline offers no specific complaints today.  REVIEW OF SYSTEMS:   Review of Systems  Constitutional: Negative.  Negative for fever, malaise/fatigue and weight loss.  Respiratory: Negative.  Negative for cough, hemoptysis and shortness of breath.   Cardiovascular: Negative.  Negative for chest pain and leg swelling.  Gastrointestinal: Negative.  Negative for abdominal pain, blood in stool and melena.  Genitourinary: Negative.  Negative for dysuria.  Musculoskeletal: Negative.  Negative for back pain.  Skin: Negative.  Negative for rash.  Neurological: Negative.  Negative for focal weakness, weakness and headaches.  Psychiatric/Behavioral: Negative.  The patient is not nervous/anxious.     As per HPI. Otherwise, a complete review of systems is negative.  PAST MEDICAL HISTORY: Past Medical History:  Diagnosis Date  . Aneurysm, thoracic aortic ([place])   . CAD in native artery  08/14/2007   followed by cardiology every six months.   . Cirrhosis ([place])   . Diverticulosis   . Gastric ulcer   . History of DVT (deep vein thrombosis)   . Hyperlipidemia   . Hypertension   . IBS (irritable bowel syndrome)     PAST SURGICAL HISTORY: Past Surgical History:  Procedure Laterality Date  . CHOLECYSTECTOMY  1987  . ESOPHAGOGASTRODUODENOSCOPY  05/2009   Normal; [facility]  . Parathyroid Adenoma Resection     [facility]  . UPPER GASTROINTESTINAL ENDOSCOPY  02/2009   Mild HH, Mild GERD  . US CAROTID DOPPLER BILATERAL ([place] [facility])  05/2009   minimal plaque formation. Symptom: near syncope    FAMILY HISTORY: Family History  Problem Relation Age of Onset  . Stomach cancer Mother   . Stroke Father     ADVANCED DIRECTIVES (Y/N):  N  HEALTH MAINTENANCE: Social History   Tobacco Use  . Smoking status: Never Smoker  . Smokeless tobacco: Never Used  Substance Use Topics  . Alcohol use: No    Alcohol/week: 0.0 standard drinks  . Drug use: No     Colonoscopy:  PAP:  Bone density:  Lipid panel:  No Known Allergies  Current Outpatient Medications  Medication Sig Dispense Refill  . amLODipine (NORVASC) 5 MG tablet TAKE ONE (1) TABLET EACH DAY 30 tablet 11  . aspirin 81 MG tablet Take 81 mg by mouth daily.    Marland Kitchen atorvastatin (LIPITOR) 20 MG tablet TAKE ONE (1) TABLET EACH DAY 90 tablet 4  . brimonidine (ALPHAGAN) 0.2 % ophthalmic solution Apply 1 drop to eye 2 (two) times daily.    Marland Kitchen  dicyclomine (BENTYL) 10 MG capsule Take 1 capsule (10 mg total) by mouth every 6 (six) hours as needed (abdominal pain). 60 capsule 2  . dorzolamide-timolol (COSOPT) 22.3-6.8 MG/ML ophthalmic solution Place 1 drop into both eyes 2 (two) times daily.    Marland Kitchen lactulose (CHRONULAC) 10 GM/15ML solution Take 15 mLs (10 g total) by mouth 2 (two) times daily as needed for mild constipation. 900 mL 3  . latanoprost (XALATAN) 0.005 % ophthalmic solution Place 1 drop into both eyes at bedtime.     Marland Kitchen lisinopril (PRINIVIL,ZESTRIL) 20 MG tablet TAKE ONE (1) TABLET EACH DAY 90 tablet 3  . metoprolol (LOPRESSOR) 100 MG tablet TAKE ONE TABLET TWICE DAILY 60 tablet 11  . nitroGLYCERIN (NITROSTAT) 0.4 MG SL tablet 0.4 mg.    . pantoprazole (PROTONIX) 40 MG tablet Take 40 mg by mouth daily.    . rifaximin (XIFAXAN) 550 MG TABS tablet Take 550 mg by mouth 2 (two) times daily.    . traMADol-acetaminophen (ULTRACET) 37.5-325 MG tablet Take 1 tablet by mouth every 8 (eight) hours as needed.     . triamcinolone lotion (KENALOG) 0.1 % Apply 1 application topically 2 (two) times daily. 60 mL 3   No current facility-administered medications for this visit.     OBJECTIVE: Vitals:   04/10/18 1158  BP: (!) 153/90  Pulse: 85  Temp: 97.8 F (36.6 C)     Body mass index is 23.03 kg/m.    ECOG FS:1 - Symptomatic but completely ambulatory  General: Well-developed, well-nourished, no acute distress. Eyes: Pink conjunctiva, anicteric sclera. HEENT: Normocephalic, moist mucous membranes, clear oropharnyx. Breast: Patient refused breast exam today. Lungs: Clear to auscultation bilaterally. Heart: Regular rate and rhythm. No rubs, murmurs, or gallops. Abdomen: Soft, nontender, nondistended. No organomegaly noted, normoactive bowel sounds. Musculoskeletal: No edema, cyanosis, or clubbing. Neuro: Alert, answering all questions appropriately. Cranial nerves grossly intact. Skin: No rashes or petechiae noted. Psych: Normal affect. Lymphatics: No cervical, calvicular, axillary or inguinal LAD.   LAB RESULTS:  Lab Results  Component Value Date   NA 142 01/19/2018   K 3.9 01/19/2018   CL 111 (H) 01/19/2018   CO2 17 (L) 01/19/2018   GLUCOSE 92 01/19/2018   BUN 12 01/19/2018   CREATININE 1.06 (H) 01/19/2018   CALCIUM 9.5 01/19/2018   PROT 7.2 01/19/2018   ALBUMIN 3.7 01/19/2018   AST 23 01/19/2018   ALT 10 01/19/2018   ALKPHOS 133 (H) 01/19/2018   BILITOT 1.1 01/19/2018   GFRNONAA 49 (L)  01/19/2018   GFRAA 56 (L) 01/19/2018    Lab Results  Component Value Date   WBC 4.8 08/14/2015   NEUTROABS 4.2 12/04/2013   HGB 13.8 08/14/2015   HCT 41.4 08/14/2015   MCV 96.3 08/14/2015   PLT 67 (L) 08/14/2015     STUDIES: No results found.  ASSESSMENT: Left breast mass  PLAN:    1. Left breast mass: Incidentally noted on abdominal CT.  Patient also reports she has not had a mammogram in greater than 5 years.  Have ordered mammogram and ultrasound in the next 1 to 2 weeks for further evaluation.  If positive, will proceed with biopsy and patient will follow-up approximately 1 week after biopsy.  If negative, patient will follow-up in 1 year after CT of the chest to evaluate pulmonary nodule. 2.  Pulmonary nodule: Incidentally noted in right lower lobe.  Repeat CT scan in 1 year with follow-up 1 to 2 days later. 3.  Renal mass: Continue  follow-up with urology as indicated.  I spent a total of 45 minutes face-to-face with the patient of which greater than 50% of the visit was spent in counseling and coordination of care as detailed above.   Patient expressed understanding and was in agreement with this plan. She also understands that She can call clinic at any time with any questions, concerns, or complaints.   Cancer Staging No matching staging information was found for the patient.  Lloyd Huger, MD   04/12/2018 6:36 AM

## 2018-04-10 ENCOUNTER — Inpatient Hospital Stay: Payer: Medicare Other | Attending: Oncology | Admitting: Oncology

## 2018-04-10 ENCOUNTER — Other Ambulatory Visit: Payer: Self-pay

## 2018-04-10 VITALS — BP 153/90 | HR 85 | Temp 97.8°F | Ht 60.0 in | Wt 117.9 lb

## 2018-04-10 DIAGNOSIS — R911 Solitary pulmonary nodule: Secondary | ICD-10-CM | POA: Diagnosis not present

## 2018-04-10 DIAGNOSIS — N289 Disorder of kidney and ureter, unspecified: Secondary | ICD-10-CM | POA: Insufficient documentation

## 2018-04-10 DIAGNOSIS — N6320 Unspecified lump in the left breast, unspecified quadrant: Secondary | ICD-10-CM

## 2018-04-10 DIAGNOSIS — Z800 Family history of malignant neoplasm of digestive organs: Secondary | ICD-10-CM | POA: Diagnosis not present

## 2018-04-10 NOTE — Progress Notes (Signed)
Patient is here to establish care for a pulmonary nodule and breast mass. Patient stated that she had been having abdominal pain for many years. Patient denied fever, chills, constipation, diarrhea, nausea, diarrhea.

## 2018-07-15 ENCOUNTER — Other Ambulatory Visit: Payer: Self-pay | Admitting: Family Medicine

## 2018-07-24 ENCOUNTER — Other Ambulatory Visit: Payer: Self-pay | Admitting: Family Medicine

## 2018-08-22 ENCOUNTER — Other Ambulatory Visit: Payer: Self-pay

## 2018-08-22 MED ORDER — LACTULOSE 10 GM/15ML PO SOLN: 10.0000 g | Freq: Two times a day (BID) | ORAL | 3 refills | Status: AC | PRN

## 2018-08-22 NOTE — Telephone Encounter (Signed)
Patient's son is requesting a refill on lactulose (Terri Bird) 10 GM/15ML solutionbe sent to [facility].

## 2018-11-08 DIAGNOSIS — H401133 Primary open-angle glaucoma, bilateral, severe stage: Secondary | ICD-10-CM | POA: Diagnosis not present

## 2019-02-07 ENCOUNTER — Encounter: Payer: Self-pay | Admitting: *Deleted

## 2019-04-04 ENCOUNTER — Ambulatory Visit: Payer: Medicare Other | Admitting: Urology

## 2019-04-04 DIAGNOSIS — H401123 Primary open-angle glaucoma, left eye, severe stage: Secondary | ICD-10-CM | POA: Diagnosis not present

## 2019-04-11 ENCOUNTER — Ambulatory Visit: Payer: Medicare Other

## 2019-04-15 ENCOUNTER — Inpatient Hospital Stay: Payer: Medicare Other | Admitting: Oncology

## 2019-04-17 ENCOUNTER — Ambulatory Visit: Payer: Medicare Other | Admitting: Urology

## 2019-04-24 ENCOUNTER — Other Ambulatory Visit: Payer: Medicare Other

## 2019-04-26 ENCOUNTER — Telehealth: Payer: Self-pay | Admitting: *Deleted

## 2019-04-26 NOTE — Telephone Encounter (Signed)
Received call from [facility] from radiology regarding order for CTA-would not be able to visualize kidney. Per Dr. Erlene Quan proceed with study. Radiology aware.

## 2019-04-26 NOTE — Progress Notes (Deleted)
[place]  [phone] [phone]  ID: Terri Bird OB: [birth year]  MR#: [id number]  [id number]  Patient Care Team: [name], MD as PCP - General (Family Medicine) Yolonda Kida, MD as Consulting Physician (Cardiology) Lucky Cowboy Erskine Squibb, MD as Referring Physician (Vascular Surgery) Ok Edwards, NP as Nurse Practitioner (Gastroenterology)  CHIEF COMPLAINT: Left breast mass.  INTERVAL HISTORY: Patient is an 86 year old female who underwent CT scan for follow-up for renal mass and was noted to have an incidental abnormality in her left breast.  She was also noted to have a 4 mm right lower lobe pulmonary nodule.  She currently feels well and is asymptomatic.  She has no neurologic complaints.  She denies any recent fevers or illnesses.  She has a good appetite and denies weight loss.  She has no chest pain or shortness of breath.  She denies any nausea, vomiting, constipation, or diarrhea.  She has no urinary complaints.  Patient feels that her baseline offers no specific complaints today.  REVIEW OF SYSTEMS:   Review of Systems  Constitutional: Negative.  Negative for fever, malaise/fatigue and weight loss.  Respiratory: Negative.  Negative for cough, hemoptysis and shortness of breath.   Cardiovascular: Negative.  Negative for chest pain and leg swelling.  Gastrointestinal: Negative.  Negative for abdominal pain, blood in stool and melena.  Genitourinary: Negative.  Negative for dysuria.  Musculoskeletal: Negative.  Negative for back pain.  Skin: Negative.  Negative for rash.  Neurological: Negative.  Negative for focal weakness, weakness and headaches.  Psychiatric/Behavioral: Negative.  The patient is not nervous/anxious.     As per HPI. Otherwise, a complete review of systems is negative.  PAST MEDICAL HISTORY: Past Medical History:  Diagnosis Date  . Aneurysm, thoracic aortic ([place])   . CAD in native artery  08/14/2007   followed by cardiology every six months.   . Cirrhosis ([place])   . Diverticulosis   . Gastric ulcer   . History of DVT (deep vein thrombosis)   . Hyperlipidemia   . Hypertension   . IBS (irritable bowel syndrome)     PAST SURGICAL HISTORY: Past Surgical History:  Procedure Laterality Date  . CHOLECYSTECTOMY  1987  . ESOPHAGOGASTRODUODENOSCOPY  05/2009   Normal; [facility]  . Parathyroid Adenoma Resection     [facility]  . UPPER GASTROINTESTINAL ENDOSCOPY  02/2009   Mild HH, Mild GERD  . US CAROTID DOPPLER BILATERAL ([place] [facility])  05/2009   minimal plaque formation. Symptom: near syncope    FAMILY HISTORY: Family History  Problem Relation Age of Onset  . Stomach cancer Mother   . Stroke Father     ADVANCED DIRECTIVES (Y/N):  N  HEALTH MAINTENANCE: Social History   Tobacco Use  . Smoking status: Never Smoker  . Smokeless tobacco: Never Used  Substance Use Topics  . Alcohol use: No    Alcohol/week: 0.0 standard drinks  . Drug use: No     Colonoscopy:  PAP:  Bone density:  Lipid panel:  No Known Allergies  Current Outpatient Medications  Medication Sig Dispense Refill  . amLODipine (NORVASC) 5 MG tablet TAKE 1 TABLET BY MOUTH EVERY DAY 30 tablet 11  . aspirin 81 MG tablet Take 81 mg by mouth daily.    Marland Kitchen atorvastatin (LIPITOR) 20 MG tablet TAKE ONE (1) TABLET EACH DAY 90 tablet 4  . brimonidine (ALPHAGAN) 0.2 % ophthalmic solution Apply 1 drop to eye 2 (two) times daily.    Marland Kitchen  dicyclomine (BENTYL) 10 MG capsule Take 1 capsule (10 mg total) by mouth every 6 (six) hours as needed (abdominal pain). 60 capsule 2  . dorzolamide-timolol (COSOPT) 22.3-6.8 MG/ML ophthalmic solution Place 1 drop into both eyes 2 (two) times daily.    Marland Kitchen lactulose (CHRONULAC) 10 GM/15ML solution Take 15 mLs (10 g total) by mouth 2 (two) times daily as needed for mild constipation. 900 mL 3  . latanoprost (XALATAN) 0.005 % ophthalmic solution Place 1 drop into both eyes at  bedtime.    Marland Kitchen lisinopril (ZESTRIL) 20 MG tablet TAKE 1 TABLET BY MOUTH EVERY DAY 90 tablet 3  . metoprolol (LOPRESSOR) 100 MG tablet TAKE ONE TABLET TWICE DAILY 60 tablet 11  . nitroGLYCERIN (NITROSTAT) 0.4 MG SL tablet 0.4 mg.    . pantoprazole (PROTONIX) 40 MG tablet Take 40 mg by mouth daily.    . rifaximin (XIFAXAN) 550 MG TABS tablet Take 550 mg by mouth 2 (two) times daily.    . traMADol-acetaminophen (ULTRACET) 37.5-325 MG tablet Take 1 tablet by mouth every 8 (eight) hours as needed.     . triamcinolone lotion (KENALOG) 0.1 % Apply 1 application topically 2 (two) times daily. 60 mL 3   No current facility-administered medications for this visit.    OBJECTIVE: There were no vitals filed for this visit.   There is no height or weight on file to calculate BMI.    ECOG FS:1 - Symptomatic but completely ambulatory  General: Well-developed, well-nourished, no acute distress. Eyes: Pink conjunctiva, anicteric sclera. HEENT: Normocephalic, moist mucous membranes, clear oropharnyx. Breast: Patient refused breast exam today. Lungs: Clear to auscultation bilaterally. Heart: Regular rate and rhythm. No rubs, murmurs, or gallops. Abdomen: Soft, nontender, nondistended. No organomegaly noted, normoactive bowel sounds. Musculoskeletal: No edema, cyanosis, or clubbing. Neuro: Alert, answering all questions appropriately. Cranial nerves grossly intact. Skin: No rashes or petechiae noted. Psych: Normal affect. Lymphatics: No cervical, calvicular, axillary or inguinal LAD.   LAB RESULTS:  Lab Results  Component Value Date   NA 142 01/19/2018   K 3.9 01/19/2018   CL 111 (H) 01/19/2018   CO2 17 (L) 01/19/2018   GLUCOSE 92 01/19/2018   BUN 12 01/19/2018   CREATININE 1.06 (H) 01/19/2018   CALCIUM 9.5 01/19/2018   PROT 7.2 01/19/2018   ALBUMIN 3.7 01/19/2018   AST 23 01/19/2018   ALT 10 01/19/2018   ALKPHOS 133 (H) 01/19/2018   BILITOT 1.1 01/19/2018   GFRNONAA 49 (L) 01/19/2018    GFRAA 56 (L) 01/19/2018    Lab Results  Component Value Date   WBC 4.8 08/14/2015   NEUTROABS 4.2 12/04/2013   HGB 13.8 08/14/2015   HCT 41.4 08/14/2015   MCV 96.3 08/14/2015   PLT 67 (L) 08/14/2015     STUDIES: No results found.  ASSESSMENT: Left breast mass  PLAN:    1. Left breast mass: Incidentally noted on abdominal CT.  Patient also reports she has not had a mammogram in greater than 5 years.  Have ordered mammogram and ultrasound in the next 1 to 2 weeks for further evaluation.  If positive, will proceed with biopsy and patient will follow-up approximately 1 week after biopsy.  If negative, patient will follow-up in 1 year after CT of the chest to evaluate pulmonary nodule. 2.  Pulmonary nodule: Incidentally noted in right lower lobe.  Repeat CT scan in 1 year with follow-up 1 to 2 days later. 3.  Renal mass: Continue follow-up with urology as indicated.  I  spent a total of 45 minutes face-to-face with the patient of which greater than 50% of the visit was spent in counseling and coordination of care as detailed above.   Patient expressed understanding and was in agreement with this plan. She also understands that She can call clinic at any time with any questions, concerns, or complaints.   Cancer Staging No matching staging information was found for the patient.  Lloyd Huger, MD   04/26/2019 6:37 AM

## 2019-04-29 ENCOUNTER — Other Ambulatory Visit: Payer: Self-pay | Admitting: Urology

## 2019-04-29 ENCOUNTER — Other Ambulatory Visit: Payer: Self-pay

## 2019-04-29 ENCOUNTER — Ambulatory Visit
Admission: RE | Admit: 2019-04-29 | Discharge: 2019-04-29 | Disposition: A | Discharge status: Home / Self Care | Payer: Medicare Other | Source: Ambulatory Visit | Attending: Urology | Admitting: Urology

## 2019-04-29 DIAGNOSIS — I77819 Aortic ectasia, unspecified site: Secondary | ICD-10-CM | POA: Diagnosis not present

## 2019-04-29 DIAGNOSIS — R911 Solitary pulmonary nodule: Secondary | ICD-10-CM | POA: Insufficient documentation

## 2019-04-29 DIAGNOSIS — R918 Other nonspecific abnormal finding of lung field: Secondary | ICD-10-CM | POA: Diagnosis not present

## 2019-04-29 LAB — POCT I-STAT CREATININE: Creatinine, Ser: 1.2 mg/dL — ABNORMAL HIGH (ref 0.44–1.00)

## 2019-04-29 MED ORDER — IOHEXOL 350 MG/ML SOLN
50.0000 mL | Freq: Once | INTRAVENOUS | Status: AC | PRN
  Administered 2019-04-29: 50 mL via INTRAVENOUS

## 2019-04-29 MED ORDER — IOHEXOL 350 MG/ML SOLN: 50.0000 mL | Freq: Once | INTRAVENOUS | Status: DC | PRN

## 2019-05-01 ENCOUNTER — Encounter: Payer: Self-pay | Admitting: Urology

## 2019-05-01 ENCOUNTER — Other Ambulatory Visit: Payer: Self-pay

## 2019-05-01 ENCOUNTER — Ambulatory Visit (INDEPENDENT_AMBULATORY_CARE_PROVIDER_SITE_OTHER): Payer: Medicare Other | Admitting: Urology

## 2019-05-01 VITALS — BP 135/70 | HR 79 | Ht 60.0 in

## 2019-05-01 DIAGNOSIS — I219 Acute myocardial infarction, unspecified: Secondary | ICD-10-CM | POA: Insufficient documentation

## 2019-05-01 DIAGNOSIS — N281 Cyst of kidney, acquired: Secondary | ICD-10-CM | POA: Diagnosis not present

## 2019-05-01 DIAGNOSIS — I77819 Aortic ectasia, unspecified site: Secondary | ICD-10-CM

## 2019-05-01 DIAGNOSIS — E785 Hyperlipidemia, unspecified: Secondary | ICD-10-CM | POA: Insufficient documentation

## 2019-05-01 DIAGNOSIS — R911 Solitary pulmonary nodule: Secondary | ICD-10-CM

## 2019-05-01 DIAGNOSIS — I639 Cerebral infarction, unspecified: Secondary | ICD-10-CM | POA: Insufficient documentation

## 2019-05-01 NOTE — Progress Notes (Signed)
05/01/2019 2:57 PM   Terri Bird 03-18-1933 [id number]  Referring provider: [name], MD [address] [place],  [place] [address]  F/u renal cyst  HPI: 86 year old female who presents today for follow-up of a left upper pole renal mass.  She is accompanied today by her son.  He provides most of the history.  He insinuated today that she has been having increasing difficulties with her memory.  She is currently being followed for a Bosniak 2 vs 3 left upper pole renal lesion.  Is been slowly enlarging since 2015, currently measuring approximately 3.6 cm.  She follows up today with annual imaging.  This was in the form of CT angiogram as per radiology recommendations for follow-up of aortic dilation in order to try to limit the number of studies.  No flank pain or gross hematuria.   PMH: Past Medical History:  Diagnosis Date  . Aneurysm, thoracic aortic ([place])   . CAD in native artery 08/14/2007   followed by cardiology every six months.   . Cirrhosis ([place])   . Diverticulosis   . Gastric ulcer   . History of DVT (deep vein thrombosis)   . Hyperlipidemia   . Hypertension   . IBS (irritable bowel syndrome)     Surgical History: Past Surgical History:  Procedure Laterality Date  . CHOLECYSTECTOMY  1987  . ESOPHAGOGASTRODUODENOSCOPY  05/2009   Normal; [facility]  . Parathyroid Adenoma Resection     [facility]  . UPPER GASTROINTESTINAL ENDOSCOPY  02/2009   Mild HH, Mild GERD  . US CAROTID DOPPLER BILATERAL ([place] [facility]X)  05/2009   minimal plaque formation. Symptom: near syncope    Home Medications:  Allergies as of 05/01/2019   No Known Allergies     Medication List       Accurate as of May 01, 2019  2:57 PM. If you have any questions, ask your nurse or doctor.        amLODipine 5 MG tablet Commonly known as: NORVASC TAKE 1 TABLET BY MOUTH EVERY DAY   aspirin 81 MG tablet Take 81 mg by mouth daily.   atorvastatin 20 MG  tablet Commonly known as: LIPITOR TAKE ONE (1) TABLET EACH DAY   brimonidine 0.2 % ophthalmic solution Commonly known as: ALPHAGAN Apply 1 drop to eye 2 (two) times daily.   dicyclomine 10 MG capsule Commonly known as: BENTYL Take 1 capsule (10 mg total) by mouth every 6 (six) hours as needed (abdominal pain).   dorzolamide-timolol 22.3-6.8 MG/ML ophthalmic solution Commonly known as: COSOPT Place 1 drop into both eyes 2 (two) times daily.   lactulose 10 GM/15ML solution Commonly known as: CHRONULAC Take 15 mLs (10 g total) by mouth 2 (two) times daily as needed for mild constipation.   latanoprost 0.005 % ophthalmic solution Commonly known as: XALATAN Place 1 drop into both eyes at bedtime.   lisinopril 20 MG tablet Commonly known as: ZESTRIL TAKE 1 TABLET BY MOUTH EVERY DAY   metoprolol tartrate 100 MG tablet Commonly known as: LOPRESSOR TAKE ONE TABLET TWICE DAILY   nitroGLYCERIN 0.4 MG SL tablet Commonly known as: NITROSTAT 0.4 mg.   pantoprazole 40 MG tablet Commonly known as: PROTONIX Take 40 mg by mouth daily.   rifaximin 550 MG Tabs tablet Commonly known as: XIFAXAN Take 550 mg by mouth 2 (two) times daily.   traMADol-acetaminophen 37.5-325 MG tablet Commonly known as: ULTRACET Take 1 tablet by mouth every 8 (eight) hours as  needed.   triamcinolone lotion 0.1 % Commonly known as: KENALOG Apply 1 application topically 2 (two) times daily.       Allergies: No Known Allergies  Family History: Family History  Problem Relation Age of Onset  . Stomach cancer Mother   . Stroke Father     Social History:  reports that she has never smoked. She has never used smokeless tobacco. She reports that she does not drink alcohol or use drugs.  Physical Exam: BP 135/70   Pulse 79   Ht 5' (1.524 m)   BMI 23.03 kg/m   Constitutional:  Alert and oriented, No acute distress.  Accompanied by son today.  He provides most of the history.  She is able to answer  most questions.  In wheelchair.  Frail. HEENT: [place] AT, moist mucus membranes.  Trachea midline, no masses. Cardiovascular: No clubbing, cyanosis, or edema. Respiratory: Normal respiratory effort, no increased work of breathing.. Skin: No rashes, bruises or suspicious lesions. Neurologic: Grossly intact, no focal deficits, moving all 4 extremities. Psychiatric: Normal mood and affect.  Laboratory Data: Lab Results  Component Value Date   WBC 4.8 08/14/2015   HGB 13.8 08/14/2015   HCT 41.4 08/14/2015   MCV 96.3 08/14/2015   PLT 67 (L) 08/14/2015    Lab Results  Component Value Date   CREATININE 1.20 (H) 04/29/2019    Pertinent Imaging: CLINICAL DATA:  Right renal mass with incidental right lower lobe pulmonary nodule. History of TAA.  EXAM: CT ANGIOGRAPHY CHEST WITH CONTRAST  TECHNIQUE: Multidetector CT imaging of the chest was performed using the standard protocol during bolus administration of intravenous contrast. Multiplanar CT image reconstructions and MIPs were obtained to evaluate the vascular anatomy.  CONTRAST:  50mL OMNIPAQUE IOHEXOL 350 MG/ML SOLN  COMPARISON:  Partial comparison to CT abdomen dated 02/28/2018  FINDINGS: Note: CT chest without contrast order by Dr. Grayland Ormond. CTA chest (with contrast) ordered by Dr. Erlene Quan. Findings from both studies will be reported below.  Cardiovascular: The heart is top-normal in size. No pericardial effusion.  Preferential opacification of the thoracic aorta. No evidence of thoracic aortic dissection. Ectasia of the ascending thoracic aorta, measuring 4.0 cm (series 7/image 39). Atherosclerotic calcifications of the aortic arch. Descending thoracic aortic stent.  Three vessel coronary atherosclerosis with LAD stent.  Mediastinum/Nodes: No suspicious mediastinal lymphadenopathy.  Visualized thyroid is unremarkable.  Lungs/Pleura: Prior 4 mm right lower lobe nodule is no longer evident.  Platelike  scarring inferiorly in the right upper lobe (series 5/image 48).  Two 2-3 mm nodules in the right upper lobe (series 3/images 50 and 54), likely benign.  Mild subpleural reticulation/fibrosis in the lungs bilaterally, lower lobe predominant, suggesting very mild chronic interstitial lung disease.  No focal consolidation.  No pleural effusion or pneumothorax.  Upper Abdomen: Visualized upper abdomen is notable for cirrhosis, prior cholecystectomy, vascular calcifications, a celiac artery stent.  Multiple bilateral renal lesions are poorly evaluated on the current study, including a 3.7 cm lesion with thin enhancing septations in the right upper pole (series 11/image 19), which previously measured 3.6 cm, and a dominant 5.1 cm rim calcified hemorrhagic lesion in the lateral right lower pole (series 2/image 151), unchanged.  Musculoskeletal: Degenerative changes of the visualized thoracolumbar spine.  Review of the MIP images confirms the above findings.  IMPRESSION: Prior 4 mm right lower lobe nodule is no longer evident.  Two right upper lobe nodules measuring 2-3 mm, likely benign. Consider follow-up CT chest in 6 months. Please note  that [place] guidelines do not apply in the setting suspected malignancy.  Ectasia of the ascending thoracic aorta, measuring 4.0 cm. Descending thoracic aortic stent.  Multiple bilateral renal lesions are poorly evaluated on current CT chest. 3.7 cm right upper pole lesion with enhancing septations, previously 3.6 cm. 5.1 cm hemorrhagic lesion in the lateral right lower pole, unchanged.  Aortic Atherosclerosis (ICD10-I70.0).   Electronically Signed   By: Julian Hy M.D.   On: 04/29/2019 14:18  CT scan personally reviewed.  Agree with radiology interpretation.   Assessment & Plan:    1. Acquired cyst of kidney We discussed today that the right upper pole lesion which was previously consistent with  Bosniak 37F is stable in size which is reassuring  Given that this was a angio for the purpose of aortic dilation surveillance, the characteristics of the cystic lesion were not well characterized on this particular study but the fact that the lesion has not changed in size over the past year is very reassuring.  Given her frailty and medical comorbidities, she is unlikely to be a surgical or procedural candidate at this point.  She is asymptomatic from this.  At this point in time, we discussed de-escalating imaging to a renal ultrasound in a year.  If the renal ultrasound remains stable that point, may defer further evaluations  Both she and her son are agreeable this plan. - US RENAL; Future  2. Pulmonary nodule FAvorable CT scan as above, follow-up with Dr. Grayland Ormond as scheduled  3. Aortic dilatation (HCC) Stable, will defer further surveillance to primary care physician for this   Follow-up in 1 year with renal ultrasound prior  Hollice Espy, MD  [facility] [address], [place], [place] [address] [phone]

## 2019-05-02 ENCOUNTER — Ambulatory Visit: Payer: Medicare Other | Attending: Internal Medicine

## 2019-05-02 ENCOUNTER — Other Ambulatory Visit: Payer: Self-pay

## 2019-05-02 ENCOUNTER — Inpatient Hospital Stay: Payer: Medicare Other | Admitting: Oncology

## 2019-05-02 DIAGNOSIS — Z20822 Contact with and (suspected) exposure to covid-19: Secondary | ICD-10-CM

## 2019-05-03 LAB — NOVEL CORONAVIRUS, NAA: SARS-CoV-2, NAA: NOT DETECTED

## 2019-05-08 ENCOUNTER — Other Ambulatory Visit: Payer: Self-pay | Admitting: Family Medicine

## 2019-05-08 NOTE — Telephone Encounter (Signed)
Copied from [place] [id number]. Topic: Quick Communication - Rx Refill/Question >> May 08, 2019 11:09 AM Leward Quan A wrote: Medication: rifaximin (XIFAXAN) 550 MG TABS tablet   Has not had any in 5 days please advise. Appointment scheduled 05/20/19  Has the patient contacted their pharmacy? Yes.   (Agent: If no, request that the patient contact the pharmacy for the refill.) (Agent: If yes, when and what did the pharmacy advise?)  Preferred Pharmacy (with phone number or street name): [facility] [id number], [place]  Phone:  [phone] Fax:  [phone]     Agent: Please be advised that RX refills may take up to 3 business days. We ask that you follow-up with your pharmacy.

## 2019-05-08 NOTE — Telephone Encounter (Signed)
Requested medication (s) are due for refill today: Yes  Requested medication (s) are on the active medication list: Yes  Last refill:  11/02/14  Future visit scheduled: Yes  Notes to clinic:  Historical provider, prescription has expired.    Requested Prescriptions  Pending Prescriptions Disp Refills   rifaximin (XIFAXAN) 550 MG TABS tablet 42 tablet     Sig: Take 1 tablet (550 mg total) by mouth 2 (two) times daily.      Off-Protocol Failed - 05/08/2019 11:12 AM      Failed - Medication not assigned to a protocol, review manually.      Failed - Valid encounter within last 12 months    Recent Outpatient Visits           1 year ago Eczema, unspecified type   [facility] [name], MD   2 years ago Eczema, unspecified type   [facility] [name], MD   3 years ago Acanthosis nigricans   [facility] [name], MD   4 years ago CAD in native artery   [facility] [name], MD       Future Appointments             In 12 months Hollice Espy, MD [place]

## 2019-05-20 ENCOUNTER — Encounter: Payer: Self-pay | Admitting: Family Medicine

## 2019-05-20 ENCOUNTER — Ambulatory Visit (INDEPENDENT_AMBULATORY_CARE_PROVIDER_SITE_OTHER): Payer: Medicare Other | Admitting: Family Medicine

## 2019-05-20 DIAGNOSIS — K7460 Unspecified cirrhosis of liver: Secondary | ICD-10-CM | POA: Diagnosis not present

## 2019-05-20 DIAGNOSIS — I10 Essential (primary) hypertension: Secondary | ICD-10-CM

## 2019-05-20 DIAGNOSIS — F0390 Unspecified dementia without behavioral disturbance: Secondary | ICD-10-CM

## 2019-05-20 MED ORDER — RIFAXIMIN 550 MG PO TABS: 550.0000 mg | ORAL_TABLET | Freq: Two times a day (BID) | ORAL | 2 refills | Status: AC

## 2019-05-20 NOTE — Progress Notes (Signed)
Patient: Terri Bird Female    DOB: 01-Sep-1932   86 y.o.   MRN: [id number] Visit Date: 05/20/2019  Today's Provider: Lelon Huh, MD   Chief Complaint  Patient presents with  . Hyperlipidemia  . Hypertension   Subjective:    Virtual Visit via Video Note  I connected with Terri Bird on 05/20/19 at 10:20 AM EST by a video enabled telemedicine application and verified that I am speaking with the correct person using two identifiers.   I discussed the limitations of evaluation and management by telemedicine and the availability of in person appointments. The patient expressed understanding and agreed to proceed.   HPI  Hypertension, follow-up:  BP Readings from Last 3 Encounters:  05/01/19 135/70  04/10/18 (!) 153/90  04/03/18 (!) 158/82    She was last seen for hypertension  More than 1 year ago.  BP at that visit was 132/70. Management since that visit includes advising patient to drink more water due to dehydration. She reports fair compliance with treatment. Patient has not been taking Metoprolol. Patient son states she was taken off Metorprolol She is not having side effects.  She is not exercising. She is not adherent to low salt diet.   Outside blood pressures are checked occasionally. She is experiencing none.  Patient denies chest pain, chest pressure/discomfort, claudication, dyspnea, exertional chest pressure/discomfort, fatigue, irregular heart beat, lower extremity edema, near-syncope, orthopnea, palpitations, paroxysmal nocturnal dyspnea, syncope and tachypnea.   Cardiovascular risk factors include dyslipidemia and hypertension.  Use of agents associated with hypertension: NSAIDS.     Weight trend: fluctuating a bit Wt Readings from Last 3 Encounters:  04/10/18 117 lb 14.4 oz (53.5 kg)  04/03/18 120 lb (54.4 kg)  01/19/18 117 lb (53.1 kg)    Current diet: well  balanced  ------------------------------------------------------------------------  Lipid/Cholesterol, Follow-up:   Last seen for this 4 years ago.  Management changes since that visit include none. . Last Lipid Panel:    Component Value Date/Time   CHOL 137 10/30/2014 1022   TRIG 73 10/30/2014 1022   HDL 55 10/30/2014 1022   CHOLHDL 2.5 10/30/2014 1022   LDLCALC 67 10/30/2014 1022    Risk factors for vascular disease include hypercholesterolemia and hypertension  She reports poor compliance with treatment. Patient has not been taking any medication to help lower cholesterol. She is not having side effects.  Current symptoms include none and have been stable. Weight trend: fluctuating a bit Prior visit with dietician: no Current diet: well balanced Current exercise: none  Wt Readings from Last 3 Encounters:  04/10/18 117 lb 14.4 oz (53.5 kg)  04/03/18 120 lb (54.4 kg)  01/19/18 117 lb (53.1 kg)    -------------------------------------------------------------------  She is on Xifaxin due to hepatic encephalopathy. She has not been to see GI since 2019 and needs medication refilled.  No Known Allergies   Current Outpatient Medications:  .  amLODipine (NORVASC) 5 MG tablet, TAKE 1 TABLET BY MOUTH EVERY DAY, Disp: 30 tablet, Rfl: 11 .  aspirin 81 MG tablet, Take 81 mg by mouth daily., Disp: , Rfl:  .  brimonidine (ALPHAGAN) 0.2 % ophthalmic solution, Apply 1 drop to eye 2 (two) times daily., Disp: , Rfl:  .  dicyclomine (BENTYL) 10 MG capsule, Take 1 capsule (10 mg total) by mouth every 6 (six) hours as needed (abdominal pain)., Disp: 60 capsule, Rfl: 2 .  dorzolamide-timolol (COSOPT) 22.3-6.8 MG/ML ophthalmic solution, Place 1 drop into both eyes  2 (two) times daily., Disp: , Rfl:  .  latanoprost (XALATAN) 0.005 % ophthalmic solution, Place 1 drop into both eyes at bedtime., Disp: , Rfl:  .  lisinopril (ZESTRIL) 20 MG tablet, TAKE 1 TABLET BY MOUTH EVERY DAY, Disp: 90  tablet, Rfl: 3 .  pantoprazole (PROTONIX) 40 MG tablet, Take 40 mg by mouth daily., Disp: , Rfl:  .  traMADol-acetaminophen (ULTRACET) 37.5-325 MG tablet, Take 1 tablet by mouth every 8 (eight) hours as needed. , Disp: , Rfl:  .  triamcinolone lotion (KENALOG) 0.1 %, Apply 1 application topically 2 (two) times daily., Disp: 60 mL, Rfl: 3 .  atorvastatin (LIPITOR) 20 MG tablet, TAKE ONE (1) TABLET EACH DAY (Patient not taking: Reported on 05/20/2019), Disp: 90 tablet, Rfl: 4 .  lactulose (CHRONULAC) 10 GM/15ML solution, Take 15 mLs (10 g total) by mouth 2 (two) times daily as needed for mild constipation. (Patient not taking: Reported on 05/20/2019), Disp: 900 mL, Rfl: 3 .  metoprolol (LOPRESSOR) 100 MG tablet, TAKE ONE TABLET TWICE DAILY (Patient not taking: Reported on 05/20/2019), Disp: 60 tablet, Rfl: 11 .  nitroGLYCERIN (NITROSTAT) 0.4 MG SL tablet, 0.4 mg., Disp: , Rfl:  .  rifaximin (XIFAXAN) 550 MG TABS tablet, Take 550 mg by mouth 2 (two) times daily., Disp: , Rfl:   Review of Systems  Constitutional: Negative for appetite change, chills, fatigue and fever.  Respiratory: Negative for chest tightness and shortness of breath.   Cardiovascular: Negative for chest pain and palpitations.  Gastrointestinal: Negative for abdominal pain, nausea and vomiting.  Neurological: Negative for dizziness and weakness.    Social History   Tobacco Use  . Smoking status: Never Smoker  . Smokeless tobacco: Never Used  Substance Use Topics  . Alcohol use: No    Alcohol/week: 0.0 standard drinks      Objective:   There were no vitals taken for this visit. There were no vitals filed for this visit.There is no height or weight on file to calculate BMI.  Telephone visit. Patient with demential, unable to coherently answer questions. Telephone interview done with her son Terri Bird.     Assessment & Plan    1. Essential hypertension Well controlled.  Continue current medications.    2. Cirrhosis of liver  without ascites, unspecified hepatic cirrhosis type (HCC) refill- rifaximin (XIFAXAN) 550 MG TABS tablet; Take 1 tablet (550 mg total) by mouth 2 (two) times daily.  Dispense: 180 tablet; Refill: 2  3. Dementia without behavioral disturbance, unspecified dementia type (HCC) Stable.   Needs in office follow up visit and labs in 3-4 months.        I discussed the assessment and treatment plan with the patient. The patient was provided an opportunity to ask questions and all were answered. The patient agreed with the plan and demonstrated an understanding of the instructions.   The patient was advised to call back or seek an in-person evaluation if the symptoms worsen or if the condition fails to improve as anticipated.  I provided 8 minutes of non-face-to-face time during this encounter.   The entirety of the information documented in the History of Present Illness, Review of Systems and Physical Exam were personally obtained by me. Portions of this information were initially documented by Meyer Cory, CMA and reviewed by me for thoroughness and accuracy.    Lelon Huh, MD  [facility]

## 2019-05-20 NOTE — Patient Instructions (Signed)
.   Please review the attached list of medications and notify my office if there are any errors.   . Please bring all of your medications to every appointment so we can make sure that our medication list is the same as yours.   

## 2019-06-17 ENCOUNTER — Telehealth: Payer: Self-pay | Admitting: Family Medicine

## 2019-06-17 NOTE — Telephone Encounter (Signed)
Spoke with patient's he stated his brother schedules appts for mom.  Will call back.

## 2019-08-16 ENCOUNTER — Other Ambulatory Visit: Payer: Self-pay | Admitting: Oncology

## 2019-08-16 DIAGNOSIS — R911 Solitary pulmonary nodule: Secondary | ICD-10-CM

## 2019-08-16 NOTE — Progress Notes (Signed)
  Pulmonary Nodule Clinic Telephone Note [place]   Received referral from Dr. Grayland Ormond.  HPI: Mrs. Jesko is an 86 year old female with past medical history significant for CAD, PE, hypertension, MI, stroke, hepatic cirrhosis, IBS, pancreatitis, osteoporosis who is been followed by Dr. Grayland Ormond for a lung nodule. Most recent CT chest from 04/29/2019 showed 2 right upper lobe pulmonary nodules measuring 2 to 3 mm that were likely benign findings. Recommended chest CT in approximately 6 months per Fleischner's guidelines.  She was also found to have several bilateral renal lesions and a new left breast mass. Patient was scheduled to have a mammogram which I do not believe was ever completed.  Review and Recommendations: I personally reviewed all patient's previous imaging including most recent CT chest from February 2021.  I recommend follow-up with noncontrast chest CT in 6 months from previous.  Social History: No smoking history on file.  High risk factors include: History of heavy smoking, exposure to asbestos, radium or uranium, personal family history of lung cancer, older age, sex (females greater than males), race (black and native [place] greater than weight), marginal speculation, upper lobe location, multiplicity (less than 5 nodules increases risk for malignancy) and emphysema and/or pulmonary fibrosis.   This recommendation follows the consensus statement: Guidelines for Management of Incidental Pulmonary Nodules Detected on CT Images: From the Fleischner Society 2017; Radiology 2017; 284:228-243.    I have placed order for CT scan without contrast to be completed 6 months from previous.  Disposition: Order placed for repeat CT chest. Will notify [place] in scheduling. [place] to call patient with appointment date and time. Return to pulmonary nodule clinic a few days after his repeat imaging to discuss results and plan moving forward.  Faythe Casa,  NP 08/16/2019 12:37 PM

## 2019-08-20 ENCOUNTER — Ambulatory Visit: Payer: Medicare Other | Admitting: Family Medicine

## 2019-08-20 ENCOUNTER — Ambulatory Visit: Payer: Self-pay | Admitting: Family Medicine

## 2019-08-22 ENCOUNTER — Telehealth: Payer: Self-pay | Admitting: *Deleted

## 2019-08-22 NOTE — Telephone Encounter (Signed)
Message left with pt's son regarding upcoming appts scheduled in the lung nodule clinic. Appts mailed. Instructed to call back if appts need to be changed.

## 2019-08-23 NOTE — Progress Notes (Signed)
Established patient visit   Patient: Terri Bird   DOB: Aug 10, 1932   86 y.o. Female  MRN: [id number] Visit Date: 08/26/2019  Today's healthcare provider: Lelon Huh, MD   No chief complaint on file.  Subjective    HPI Hypertension, follow-up  BP Readings from Last 3 Encounters:  05/01/19 135/70  04/10/18 (!) 153/90  04/03/18 (!) 158/82   Wt Readings from Last 3 Encounters:  04/10/18 117 lb 14.4 oz (53.5 kg)  04/03/18 120 lb (54.4 kg)  01/19/18 117 lb (53.1 kg)     She was last seen for hypertension 3 months ago via telephone visit. Management since that visit includes continuing same medication.  She reports good compliance with treatment. She is not having side effects.  She is following a Low fat diet. She is not exercising. She does not smoke.  Use of agents associated with hypertension: NSAIDS.   Outside blood pressures are . Symptoms: No chest pain No chest pressure  No palpitations No syncope  No dyspnea No orthopnea  No paroxysmal nocturnal dyspnea No lower extremity edema   Pertinent labs: Lab Results  Component Value Date   CHOL 137 10/30/2014   HDL 55 10/30/2014   LDLCALC 67 10/30/2014   TRIG 73 10/30/2014   CHOLHDL 2.5 10/30/2014   Lab Results  Component Value Date   NA 142 01/19/2018   K 3.9 01/19/2018   CREATININE 1.20 (H) 04/29/2019   GFRNONAA 49 (L) 01/19/2018   GFRAA 56 (L) 01/19/2018   GLUCOSE 92 01/19/2018     The ASCVD Risk score (Goff DC Jr., et al., 2013) failed to calculate for the following reasons:   The 2013 ASCVD risk score is only valid for ages 51 to 27   The patient has a prior MI or stroke diagnosis   --------------------------------------------------------------------------------------------------- Follow up for Dementia:  The patient was last seen for this 3 months ago. Changes made at last visit include none; problem was stable.  She reports good compliance with treatment. She feels that condition is  Unchanged. She is not having side effects.   -----------------------------------------------------------------------------------------  Follow up for Cirrhosis of liver without ascites  The patient was last seen for this 3 months ago. Changes made at last visit include none; refilled rifaximin.  She reports good compliance with treatment. She feels that condition is Unchanged. She is not having side effects.   ----------------------------------------------------------------------------------------- Lipid/Cholesterol, Follow-up  Last lipid panel Other pertinent labs  Lab Results  Component Value Date   CHOL 137 10/30/2014   HDL 55 10/30/2014   LDLCALC 67 10/30/2014   TRIG 73 10/30/2014   CHOLHDL 2.5 10/30/2014   Lab Results  Component Value Date   ALT 10 01/19/2018   AST 23 01/19/2018   PLT 67 (L) 08/14/2015   TSH 2.000 10/30/2014     She was last seen for this 1 years ago.  Management since that visit includes continuing same medications.  She reports good compliance with treatment. She is not having side effects.   Symptoms: No chest pain No chest pressure/discomfort  No dyspnea No lower extremity edema  No numbness or tingling of extremity No orthopnea  No palpitations No paroxysmal nocturnal dyspnea  No speech difficulty No syncope   Current diet: in general, a "healthy" diet   Current exercise: walking  The ASCVD Risk score (Leoti., et al., 2013) failed to calculate for the following reasons:   The 2013 ASCVD risk score is only valid for  ages 14 to 37   The patient has a prior MI or stroke diagnosis  ---------------------------------------------------------------------------------------------------     Medications: Outpatient Medications Prior to Visit  Medication Sig  . amLODipine (NORVASC) 5 MG tablet TAKE 1 TABLET BY MOUTH EVERY DAY  . aspirin 81 MG tablet Take 81 mg by mouth daily.  Marland Kitchen atorvastatin (LIPITOR) 20 MG tablet TAKE ONE (1) TABLET  EACH DAY (Patient not taking: Reported on 05/20/2019)  . brimonidine (ALPHAGAN) 0.2 % ophthalmic solution Apply 1 drop to eye 2 (two) times daily.  Marland Kitchen dicyclomine (BENTYL) 10 MG capsule Take 1 capsule (10 mg total) by mouth every 6 (six) hours as needed (abdominal pain).  . dorzolamide-timolol (COSOPT) 22.3-6.8 MG/ML ophthalmic solution Place 1 drop into both eyes 2 (two) times daily.  Marland Kitchen lactulose (CHRONULAC) 10 GM/15ML solution Take 15 mLs (10 g total) by mouth 2 (two) times daily as needed for mild constipation. (Patient not taking: Reported on 05/20/2019)  . latanoprost (XALATAN) 0.005 % ophthalmic solution Place 1 drop into both eyes at bedtime.  Marland Kitchen lisinopril (ZESTRIL) 20 MG tablet TAKE 1 TABLET BY MOUTH EVERY DAY  . metoprolol (LOPRESSOR) 100 MG tablet TAKE ONE TABLET TWICE DAILY (Patient not taking: Reported on 05/20/2019)  . nitroGLYCERIN (NITROSTAT) 0.4 MG SL tablet 0.4 mg.  . pantoprazole (PROTONIX) 40 MG tablet Take 40 mg by mouth daily.  . rifaximin (XIFAXAN) 550 MG TABS tablet Take 1 tablet (550 mg total) by mouth 2 (two) times daily.  . traMADol-acetaminophen (ULTRACET) 37.5-325 MG tablet Take 1 tablet by mouth every 8 (eight) hours as needed.   . triamcinolone lotion (KENALOG) 0.1 % Apply 1 application topically 2 (two) times daily.   No facility-administered medications prior to visit.    Review of Systems  Constitutional: Negative for appetite change, chills, fatigue and fever.  Respiratory: Negative for chest tightness and shortness of breath.   Cardiovascular: Negative for chest pain and palpitations.  Gastrointestinal: Negative for abdominal pain, nausea and vomiting.  Neurological: Negative for dizziness and weakness.      Objective    BP 122/70 (BP Location: Left Arm, Patient Position: Sitting, Cuff Size: Normal)   Pulse 74   Temp (!) 97.1 F (36.2 C) (Temporal)   Ht 5' (1.524 m)   BMI 23.03 kg/m    Physical Exam   General: Appearance:    Well developed, well  nourished female in no acute distress  Eyes:    PERRL, conjunctiva/corneas clear, EOM's intact       Lungs:     Clear to auscultation bilaterally, respirations unlabored  Heart:    Normal heart rate. Normal rhythm. No murmurs, rubs, or gallops.   MS:   All extremities are intact.   Neurologic:   Awake, alert, oriented x 2. No apparent focal neurological           defect.       No results found for any visits on 08/26/19.  Assessment & Plan     1. Essential hypertension Well controlled.  Continue current medications.   - CBC - Comprehensive metabolic panel  2. Pure hypercholesterolemia - Lipid panel   3. Hyperlipidemia, unspecified hyperlipidemia type   4. Age-related osteoporosis without current pathological fracture  - VITAMIN D 25 Hydroxy (Vit-D Deficiency, Fractures)  5. History of DVT/PE Been on warfarin and Xarelto in the past which were discontinued due to vaginal and GI bleeding. Is on ECASA now. Considering history of abnormal bleeding and chronic liver disease the risk of  more aggressive anti-coagulation likely outweighs the benefit.   6. Cirrhosis Followed at [facility]. Appears to be stable, but she is due for follow up with them.  No follow-ups on file.      The entirety of the information documented in the History of Present Illness, Review of Systems and Physical Exam were personally obtained by me. Portions of this information were initially documented by the CMA and reviewed by me for thoroughness and accuracy.      Lelon Huh, MD  [facility] [phone] (phone) [phone] (fax)  [place]

## 2019-08-26 ENCOUNTER — Encounter: Payer: Self-pay | Admitting: Family Medicine

## 2019-08-26 ENCOUNTER — Ambulatory Visit (INDEPENDENT_AMBULATORY_CARE_PROVIDER_SITE_OTHER): Payer: Medicare Other | Admitting: Family Medicine

## 2019-08-26 ENCOUNTER — Other Ambulatory Visit: Payer: Self-pay

## 2019-08-26 VITALS — BP 122/70 | HR 74 | Temp 97.1°F | Ht 60.0 in

## 2019-08-26 DIAGNOSIS — I10 Essential (primary) hypertension: Secondary | ICD-10-CM

## 2019-08-26 DIAGNOSIS — E7800 Pure hypercholesterolemia, unspecified: Secondary | ICD-10-CM | POA: Diagnosis not present

## 2019-08-26 DIAGNOSIS — E785 Hyperlipidemia, unspecified: Secondary | ICD-10-CM

## 2019-08-26 DIAGNOSIS — K7460 Unspecified cirrhosis of liver: Secondary | ICD-10-CM | POA: Diagnosis not present

## 2019-08-26 DIAGNOSIS — Z86718 Personal history of other venous thrombosis and embolism: Secondary | ICD-10-CM | POA: Diagnosis not present

## 2019-08-26 DIAGNOSIS — M810 Age-related osteoporosis without current pathological fracture: Secondary | ICD-10-CM

## 2019-08-26 DIAGNOSIS — Z86711 Personal history of pulmonary embolism: Secondary | ICD-10-CM | POA: Diagnosis not present

## 2019-08-27 ENCOUNTER — Telehealth: Payer: Self-pay

## 2019-08-27 ENCOUNTER — Encounter: Payer: Self-pay | Admitting: Family Medicine

## 2019-08-27 DIAGNOSIS — D696 Thrombocytopenia, unspecified: Secondary | ICD-10-CM | POA: Insufficient documentation

## 2019-08-27 DIAGNOSIS — E559 Vitamin D deficiency, unspecified: Secondary | ICD-10-CM | POA: Insufficient documentation

## 2019-08-27 LAB — COMPREHENSIVE METABOLIC PANEL
ALT: 5 IU/L (ref 0–32)
AST: 17 IU/L (ref 0–40)
Albumin/Globulin Ratio: 1 — ABNORMAL LOW (ref 1.2–2.2)
Albumin: 3.6 g/dL (ref 3.6–4.6)
Alkaline Phosphatase: 127 IU/L — ABNORMAL HIGH (ref 48–121)
BUN/Creatinine Ratio: 15 (ref 12–28)
BUN: 19 mg/dL (ref 8–27)
Bilirubin Total: 1 mg/dL (ref 0.0–1.2)
CO2: 16 mmol/L — ABNORMAL LOW (ref 20–29)
Calcium: 9.3 mg/dL (ref 8.7–10.3)
Chloride: 111 mmol/L — ABNORMAL HIGH (ref 96–106)
Creatinine, Ser: 1.31 mg/dL — ABNORMAL HIGH (ref 0.57–1.00)
GFR calc Af Amer: 43 mL/min/{1.73_m2} — ABNORMAL LOW (ref >59)
GFR calc non Af Amer: 37 mL/min/{1.73_m2} — ABNORMAL LOW (ref >59)
Globulin, Total: 3.5 g/dL (ref 1.5–4.5)
Glucose: 92 mg/dL (ref 65–99)
Potassium: 4 mmol/L (ref 3.5–5.2)
Sodium: 141 mmol/L (ref 134–144)
Total Protein: 7.1 g/dL (ref 6.0–8.5)

## 2019-08-27 LAB — VITAMIN D 25 HYDROXY (VIT D DEFICIENCY, FRACTURES): Vit D, 25-Hydroxy: 6.2 ng/mL — ABNORMAL LOW (ref 30.0–100.0)

## 2019-08-27 LAB — CBC
Hematocrit: 36.2 % (ref 34.0–46.6)
Hemoglobin: 11.8 g/dL (ref 11.1–15.9)
MCH: 30.5 pg (ref 26.6–33.0)
MCHC: 32.6 g/dL (ref 31.5–35.7)
MCV: 94 fL (ref 79–97)
Platelets: 80 10*3/uL — CL (ref 150–450)
RBC: 3.87 x10E6/uL (ref 3.77–5.28)
RDW: 13.1 % (ref 11.7–15.4)
WBC: 5 10*3/uL (ref 3.4–10.8)

## 2019-08-27 LAB — LIPID PANEL
Chol/HDL Ratio: 3.8 ratio (ref 0.0–4.4)
Cholesterol, Total: 199 mg/dL (ref 100–199)
HDL: 52 mg/dL (ref >39)
LDL Chol Calc (NIH): 134 mg/dL — ABNORMAL HIGH (ref 0–99)
Triglycerides: 73 mg/dL (ref 0–149)
VLDL Cholesterol Cal: 13 mg/dL (ref 5–40)

## 2019-08-27 NOTE — Telephone Encounter (Signed)
-----   Message from [name], MD sent at 08/27/2019  9:49 AM EDT ----- Platelet count is low due to  her liver disease, but not low enough to require a transfusion.  Kidney functions are a little worse. Need to drink more water.  Vitamin d levels are extremely low. Need to take OTC vitamin D3 2000 units once every day.  Need referral back to [place] for follow up of liver cirrhosis.  Need to schedule follow up here 3-4 months for vitamin and platelet follow up

## 2019-08-27 NOTE — Telephone Encounter (Signed)
Patient's son Legrand Como advised as below.

## 2019-09-08 ENCOUNTER — Other Ambulatory Visit: Payer: Self-pay | Admitting: Family Medicine

## 2019-09-08 NOTE — Telephone Encounter (Signed)
Requested Prescriptions  Pending Prescriptions Disp Refills  . amLODipine (NORVASC) 5 MG tablet [Pharmacy Med Name: AMLODIPINE BESYLATE 5MG  TABLETS] 90 tablet 1    Sig: TAKE 1 TABLET BY MOUTH EVERY DAY     Cardiovascular:  Calcium Channel Blockers Passed - 09/08/2019  2:57 PM      Passed - Last BP in normal range    BP Readings from Last 1 Encounters:  08/26/19 122/70         Passed - Valid encounter within last 6 months    Recent Outpatient Visits          1 week ago Essential hypertension   [facility] [name], MD   3 months ago Essential hypertension   [facility] [name], MD   1 year ago Eczema, unspecified type   [facility] [name], MD   2 years ago Eczema, unspecified type   [facility] [name], MD   3 years ago Acanthosis nigricans   [facility] [name], MD      Future Appointments            In 3 months Fisher, Kirstie Peri, MD [facility], [place]   In 8 months Hollice Espy, MD [place]           . lisinopril (ZESTRIL) 20 MG tablet [Pharmacy Med Name: LISINOPRIL 20MG  TABLETS] 90 tablet 1    Sig: TAKE 1 TABLET BY MOUTH EVERY DAY     Cardiovascular:  ACE Inhibitors Failed - 09/08/2019  2:57 PM      Failed - Cr in normal range and within 180 days    Creatinine  Date Value Ref Range Status  12/04/2013 0.86 0.60 - 1.30 mg/dL Final   Creatinine, Ser  Date Value Ref Range Status  08/26/2019 1.31 (H) 0.57 - 1.00 mg/dL Final         Passed - K in normal range and within 180 days    Potassium  Date Value Ref Range Status  08/26/2019 4.0 3.5 - 5.2 mmol/L Final  12/04/2013 3.7 3.5 - 5.1 mmol/L Final         Passed - Patient is not pregnant      Passed - Last BP in normal range    BP Readings from Last 1 Encounters:  08/26/19 122/70         Passed - Valid encounter within last 6 months    Recent  Outpatient Visits          1 week ago Essential hypertension   [facility] [name], MD   3 months ago Essential hypertension   [facility] [name], MD   1 year ago Eczema, unspecified type   [facility] [name], MD   2 years ago Eczema, unspecified type   [facility] [name], MD   3 years ago Acanthosis nigricans   [facility] [name], MD      Future Appointments            In 3 months Fisher, Kirstie Peri, MD [facility], [place]   In 8 months Hollice Espy, [place]

## 2019-10-29 ENCOUNTER — Ambulatory Visit
Admission: EM | Admit: 2019-10-29 | Discharge: 2019-10-29 | Disposition: A | Discharge status: Other Acute Inpatient Hospital | Payer: Medicare Other | Attending: Emergency Medicine | Admitting: Emergency Medicine

## 2019-10-29 ENCOUNTER — Encounter: Payer: Self-pay | Admitting: Emergency Medicine

## 2019-10-29 ENCOUNTER — Other Ambulatory Visit: Payer: Self-pay

## 2019-10-29 DIAGNOSIS — Z9049 Acquired absence of other specified parts of digestive tract: Secondary | ICD-10-CM | POA: Diagnosis not present

## 2019-10-29 DIAGNOSIS — R29898 Other symptoms and signs involving the musculoskeletal system: Secondary | ICD-10-CM | POA: Insufficient documentation

## 2019-10-29 DIAGNOSIS — K7460 Unspecified cirrhosis of liver: Secondary | ICD-10-CM | POA: Diagnosis not present

## 2019-10-29 DIAGNOSIS — E1122 Type 2 diabetes mellitus with diabetic chronic kidney disease: Secondary | ICD-10-CM | POA: Diagnosis not present

## 2019-10-29 DIAGNOSIS — I4510 Unspecified right bundle-branch block: Secondary | ICD-10-CM | POA: Diagnosis not present

## 2019-10-29 DIAGNOSIS — Z7982 Long term (current) use of aspirin: Secondary | ICD-10-CM | POA: Diagnosis not present

## 2019-10-29 DIAGNOSIS — Z955 Presence of coronary angioplasty implant and graft: Secondary | ICD-10-CM | POA: Diagnosis not present

## 2019-10-29 DIAGNOSIS — R1033 Periumbilical pain: Secondary | ICD-10-CM | POA: Insufficient documentation

## 2019-10-29 DIAGNOSIS — I2510 Atherosclerotic heart disease of native coronary artery without angina pectoris: Secondary | ICD-10-CM | POA: Diagnosis not present

## 2019-10-29 DIAGNOSIS — Z7901 Long term (current) use of anticoagulants: Secondary | ICD-10-CM | POA: Diagnosis not present

## 2019-10-29 DIAGNOSIS — I712 Thoracic aortic aneurysm, without rupture: Secondary | ICD-10-CM | POA: Diagnosis not present

## 2019-10-29 DIAGNOSIS — I129 Hypertensive chronic kidney disease with stage 1 through stage 4 chronic kidney disease, or unspecified chronic kidney disease: Secondary | ICD-10-CM | POA: Diagnosis not present

## 2019-10-29 DIAGNOSIS — R110 Nausea: Secondary | ICD-10-CM | POA: Diagnosis not present

## 2019-10-29 DIAGNOSIS — R0789 Other chest pain: Secondary | ICD-10-CM | POA: Diagnosis not present

## 2019-10-29 DIAGNOSIS — N183 Chronic kidney disease, stage 3 unspecified: Secondary | ICD-10-CM | POA: Diagnosis not present

## 2019-10-29 DIAGNOSIS — R109 Unspecified abdominal pain: Secondary | ICD-10-CM | POA: Diagnosis not present

## 2019-10-29 DIAGNOSIS — E785 Hyperlipidemia, unspecified: Secondary | ICD-10-CM | POA: Diagnosis not present

## 2019-10-29 DIAGNOSIS — R1084 Generalized abdominal pain: Secondary | ICD-10-CM | POA: Diagnosis not present

## 2019-10-29 LAB — URINALYSIS, COMPLETE (UACMP) WITH MICROSCOPIC
Bilirubin Urine: NEGATIVE
Glucose, UA: NEGATIVE mg/dL
Ketones, ur: NEGATIVE mg/dL
Nitrite: NEGATIVE
Specific Gravity, Urine: 1.02 (ref 1.005–1.030)
pH: 5.5 (ref 5.0–8.0)

## 2019-10-29 NOTE — ED Triage Notes (Signed)
Patient states the abdominal pain is worse with eating.

## 2019-10-29 NOTE — ED Provider Notes (Signed)
HPI  SUBJECTIVE:  Terri Bird is a 86 y.o. female who presents with periumbilical and right lower quadrant pain that got worse 5 days ago.  She describes it as sore, burning.  She states it radiates through to her back.  She reports anorexia.  She states that she has had similar pain over the past 5 years but it has gotten acutely worse.  Daughter reports bilateral lower extremity weakness starting today as well.  Patient states that she was weak because the pain was so severe.  No weakness in her upper extremities.  No nausea, vomiting, fevers, abdominal distention.  She had normal bowel movement 2 days ago.  Urinary frequency, but no other urinary complaints.  No chest pain, shortness of breath.  States the car ride over here was not painful.  She tried ginger ale and Tylenol.  The Tylenol helps.  Symptoms are worse about 30 minutes after eating.  Is not associate with walking, movement.  She reports bilateral lower extremity weakness and numbness has been present for about 2 months but is also worse today.  She has past medical history of hypertension, cirrhosis, dementia, history of DVT/PE , thoracic aortic aneurysm, coronary artery disease, diverticulosis, gastric ulcer, hyperlipidemia, IBS.  Patient denies any history of abdominal surgeries but cholecystectomy is listed.  Denies history of aortic abdominal aneurysm, atrial fibrillation, mesenteric ischemia, pancreatitis, GI bleed, abdominal surgeries, cancer, diabetes, chronic kidney disease, coronary artery disease,  CVA.  Most history obtained from daughter.  Additional history obtained from medical records.  Past Medical History:  Diagnosis Date  . Aneurysm, thoracic aortic ([place])   . CAD in native artery 08/14/2007   followed by cardiology every six months.   . Cirrhosis ([place])   . Diverticulosis   . Gastric ulcer   . History of DVT (deep vein thrombosis)   . Hyperlipidemia   . Hypertension   . IBS (irritable bowel syndrome)      Past Surgical History:  Procedure Laterality Date  . CHOLECYSTECTOMY  1987  . ESOPHAGOGASTRODUODENOSCOPY  05/2009   Normal; [facility]  . Parathyroid Adenoma Resection     [facility]  . UPPER GASTROINTESTINAL ENDOSCOPY  02/2009   Mild HH, Mild GERD  . US CAROTID DOPPLER BILATERAL ([place] HX)  05/2009   minimal plaque formation. Symptom: near syncope    Family History  Problem Relation Age of Onset  . Stomach cancer Mother   . Stroke Father     Social History   Tobacco Use  . Smoking status: Never Smoker  . Smokeless tobacco: Never Used  Vaping Use  . Vaping Use: Never used  Substance Use Topics  . Alcohol use: No    Alcohol/week: 0.0 standard drinks  . Drug use: No    No current facility-administered medications for this encounter.  Current Outpatient Medications:  .  amLODipine (NORVASC) 5 MG tablet, TAKE 1 TABLET BY MOUTH EVERY DAY, Disp: 90 tablet, Rfl: 1 .  aspirin 81 MG tablet, Take 81 mg by mouth daily., Disp: , Rfl:  .  atorvastatin (LIPITOR) 20 MG tablet, TAKE ONE (1) TABLET EACH DAY, Disp: 90 tablet, Rfl: 4 .  brimonidine (ALPHAGAN) 0.2 % ophthalmic solution, Apply 1 drop to eye 2 (two) times daily., Disp: , Rfl:  .  dicyclomine (BENTYL) 10 MG capsule, Take 1 capsule (10 mg total) by mouth every 6 (six) hours as needed (abdominal pain)., Disp: 60 capsule, Rfl: 2 .  dorzolamide-timolol (COSOPT) 22.3-6.8 MG/ML ophthalmic solution, Place 1  drop into both eyes 2 (two) times daily., Disp: , Rfl:  .  lactulose (CHRONULAC) 10 GM/15ML solution, Take 15 mLs (10 g total) by mouth 2 (two) times daily as needed for mild constipation., Disp: 900 mL, Rfl: 3 .  latanoprost (XALATAN) 0.005 % ophthalmic solution, Place 1 drop into both eyes at bedtime., Disp: , Rfl:  .  lisinopril (ZESTRIL) 20 MG tablet, TAKE 1 TABLET BY MOUTH EVERY DAY, Disp: 90 tablet, Rfl: 1 .  pantoprazole (PROTONIX) 40 MG tablet, Take 40 mg by mouth daily., Disp: , Rfl:  .  rifaximin (XIFAXAN) 550  MG TABS tablet, Take 1 tablet (550 mg total) by mouth 2 (two) times daily., Disp: 180 tablet, Rfl: 2 .  metoprolol (LOPRESSOR) 100 MG tablet, TAKE ONE TABLET TWICE DAILY, Disp: 60 tablet, Rfl: 11 .  nitroGLYCERIN (NITROSTAT) 0.4 MG SL tablet, 0.4 mg., Disp: , Rfl:  .  traMADol-acetaminophen (ULTRACET) 37.5-325 MG tablet, Take 1 tablet by mouth every 8 (eight) hours as needed. , Disp: , Rfl:  .  triamcinolone lotion (KENALOG) 0.1 %, Apply 1 application topically 2 (two) times daily., Disp: 60 mL, Rfl: 3  No Known Allergies   ROS  As noted in HPI.   Physical Exam  BP (!) 110/58 (BP Location: Left Arm)   Pulse 76   Temp 97.7 F (36.5 C) (Oral)   Resp 18   Ht 5' (1.524 m)   Wt 54.4 kg   SpO2 100%   BMI 23.44 kg/m   Constitutional: Well developed, well nourished, no acute distress Eyes:  EOMI, conjunctiva normal bilaterally HENT: Normocephalic, atraumatic,mucus membranes moist Respiratory: Normal inspiratory effort, lungs clear bilaterally Cardiovascular: Normal rate regular rhythm no murmurs rubs or gallops GI: Normal appearance.  Periumbilical tenderness.  No right lower quadrant tenderness.  Positive left lower quadrant tenderness.  Active bowel sounds.  No distention.  No guarding, rebound.  Negative obturator sign. Back: No CVAT skin: No rash, skin intact Musculoskeletal: no deformities Neurologic: Alert, answers questions appropriately, no focal neuro deficits.  Moving upper and lower extremities equally.  She is able to stand with assistance. Psychiatric: Speech and behavior appropriate   ED Course   Medications - No data to display  Orders Placed This Encounter  Procedures  . Urinalysis, Complete w Microscopic    Standing Status:   Standing    Number of Occurrences:   1    Results for orders placed or performed during the hospital encounter of 10/29/19 (from the past 24 hour(s))  Urinalysis, Complete w Microscopic Urine, Clean Catch     Status: Abnormal    Collection Time: 10/29/19  1:47 PM  Result Value Ref Range   Color, Urine YELLOW YELLOW   APPearance HAZY (A) CLEAR   Specific Gravity, Urine 1.020 1.005 - 1.030   pH 5.5 5.0 - 8.0   Glucose, UA NEGATIVE NEGATIVE mg/dL   Hgb urine dipstick TRACE (A) NEGATIVE   Bilirubin Urine NEGATIVE NEGATIVE   Ketones, ur NEGATIVE NEGATIVE mg/dL   Protein, ur TRACE (A) NEGATIVE mg/dL   Nitrite NEGATIVE NEGATIVE   Leukocytes,Ua TRACE (A) NEGATIVE   Squamous Epithelial / LPF 11-20 0 - 5   WBC, UA 11-20 0 - 5 WBC/hpf   RBC / HPF 0-5 0 - 5 RBC/hpf   Bacteria, UA FEW (A) NONE SEEN   Hyaline Casts, UA PRESENT    No results found.  ED Clinical Impression  1. Periumbilical abdominal pain   2. Weakness of both lower extremities  ED Assessment/Plan  UA is not entirely diagnostic for urinary tract infection.  Other than the frequency, she has no urinary complaints.  She has no suprapubic tenderness.  Rather her pain seems to be periumbilical.  She has no right lower quadrant tenderness although she states that her abdominal pain is located there as well.  She does have some left lower quadrant tenderness.  Given her history of MI, hypertension, hypercholesterolemia. ,  Mesenteric ischemia is in the differential given that the abdominal pain is worse with eating although she has no history of atrial fibrillation.  Pancreatitis in the differential as she is reporting periumbilical pain that radiates through to her back.  Concern for aortic abdominal aneurysm given that she has a thoracic aortic aneurysm.  She currently denies any chest pain.  Could be a perforated diverticulum, but she has no rebound or guarding. her vitals are normal, this has been going on for 5 days, she is moving both of her extremities equally, feel that she is stable go by private vehicle.  Advised daughter to take her immediately to the [facility] ED.  Patient is to be n.p.o. until ER evaluation is complete.  Daughter agrees with  plan.   No orders of the defined types were placed in this encounter.   *This clinic note was created using Dragon dictation software. Therefore, there may be occasional mistakes despite careful proofreading.   ?    Melynda Ripple, MD 10/29/19 1527

## 2019-10-29 NOTE — ED Triage Notes (Signed)
Patient is being discharged from the Urgent Care and sent to the Emergency Department via POV . Per Dr. Alphonzo Cruise, patient is in need of higher level of care due to abdominal pain. Patient is aware and verbalizes understanding of plan of care.  Vitals:   10/29/19 1329  BP: (!) 110/58  Pulse: 76  Resp: 18  Temp: 97.7 F (36.5 C)  SpO2: 100%

## 2019-10-29 NOTE — ED Triage Notes (Signed)
Patient in today c/o abdominal pain x 5 years, worse the last 4-5 days. Patient also c/o bilateral lower leg numbness x 2 months. Patient's daughter in law states the patient was so weak this morning that they had to pick her up and put her in the car to come to [facility]. Patient denies emesis or diarrhea. Patient does states she has had urinary frequency x 2 weeks.

## 2019-10-29 NOTE — Discharge Instructions (Addendum)
Concerned that she could have an emergent cause of her abdominal pain.  I think that she needs a work-up more than what we can do here at the urgent care.  Go immediately to the [facility] emergency department for evaluation.  Do not have anything to eat or drink until your evaluation is complete.

## 2019-11-01 DIAGNOSIS — H401133 Primary open-angle glaucoma, bilateral, severe stage: Secondary | ICD-10-CM | POA: Diagnosis not present

## 2019-11-04 ENCOUNTER — Telehealth: Payer: Self-pay | Admitting: Family Medicine

## 2019-11-04 NOTE — Telephone Encounter (Signed)
Copied from [place] [id number]. Topic: Medicare AWV >> Nov 04, 2019  1:54 PM Cher Nakai R wrote: Reason for CRM:  Left message for patient to call back and schedule Medicare Annual Wellness Visit (AWV) either virtually or in office.  Last AWV 12/29/2017  Please schedule at anytime with [facility] Health Advisor.  If any questions, please contact me at [phone]

## 2019-11-14 ENCOUNTER — Telehealth: Payer: Self-pay | Admitting: Family Medicine

## 2019-11-14 NOTE — Telephone Encounter (Signed)
FMLA papers being sent back for Terri Bird, son of Ms. Shamirah Ivan.  Please call him when completed [phone]

## 2019-11-18 ENCOUNTER — Ambulatory Visit: Payer: Medicare Other | Attending: Oncology

## 2019-11-19 ENCOUNTER — Inpatient Hospital Stay: Payer: Medicare Other | Admitting: Oncology

## 2019-11-26 NOTE — Progress Notes (Signed)
Subjective:   Terri Bird is a 86 y.o. female who presents for Medicare Annual (Subsequent) preventive examination.  I connected with Wade Sigala (son) today by telephone and verified that I am speaking with the correct person using two identifiers. Location patient: home Location provider: work Persons participating in the virtual visit: patient, provider.   I discussed the limitations, risks, security and privacy concerns of performing an evaluation and management service by telephone and the availability of in person appointments. I also discussed with the patient that there may be a patient responsible charge related to this service. The patient expressed understanding and verbally consented to this telephonic visit.    Interactive audio and video telecommunications were attempted between this provider and patient, however failed, due to patient having technical difficulties OR patient did not have access to video capability.  We continued and completed visit with audio only.   Review of Systems    N/A  Cardiac Risk Factors include: advanced age (>48men, >53 women);dyslipidemia;hypertension     Objective:    There were no vitals filed for this visit. There is no height or weight on file to calculate BMI.  Advanced Directives 11/27/2019 04/10/2018 12/29/2017 12/13/2016  Does Patient Have a Medical Advance Directive? No No No No  Would patient like information on creating a medical advance directive? No - Patient declined No - Patient declined No - Patient declined -    Current Medications (verified) Outpatient Encounter Medications as of 11/27/2019  Medication Sig  . amLODipine (NORVASC) 5 MG tablet TAKE 1 TABLET BY MOUTH EVERY DAY  . aspirin 81 MG tablet Take 81 mg by mouth daily.  Marland Kitchen atorvastatin (LIPITOR) 20 MG tablet TAKE ONE (1) TABLET EACH DAY  . brimonidine (ALPHAGAN) 0.2 % ophthalmic solution Apply 1 drop to eye 2 (two) times daily.  . Cholecalciferol (VITAMIN D3) 50  MCG (2000 UT) capsule Take 2,000 Units by mouth daily.  . dorzolamide-timolol (COSOPT) 22.3-6.8 MG/ML ophthalmic solution Place 1 drop into both eyes 2 (two) times daily.  Marland Kitchen lactulose (CHRONULAC) 10 GM/15ML solution Take 15 mLs (10 g total) by mouth 2 (two) times daily as needed for mild constipation.  Marland Kitchen latanoprost (XALATAN) 0.005 % ophthalmic solution Place 1 drop into both eyes at bedtime.  . pantoprazole (PROTONIX) 40 MG tablet Take 40 mg by mouth daily.  . rifaximin (XIFAXAN) 550 MG TABS tablet Take 1 tablet (550 mg total) by mouth 2 (two) times daily.  Marland Kitchen dicyclomine (BENTYL) 10 MG capsule Take 1 capsule (10 mg total) by mouth every 6 (six) hours as needed (abdominal pain). (Patient not taking: Reported on 11/27/2019)  . lisinopril (ZESTRIL) 20 MG tablet TAKE 1 TABLET BY MOUTH EVERY DAY (Patient not taking: Reported on 11/27/2019)  . metoprolol (LOPRESSOR) 100 MG tablet TAKE ONE TABLET TWICE DAILY (Patient not taking: Reported on 11/27/2019)  . nitroGLYCERIN (NITROSTAT) 0.4 MG SL tablet 0.4 mg. (Patient not taking: Reported on 11/27/2019)  . traMADol-acetaminophen (ULTRACET) 37.5-325 MG tablet Take 1 tablet by mouth every 8 (eight) hours as needed.  (Patient not taking: Reported on 11/27/2019)  . triamcinolone lotion (KENALOG) 0.1 % Apply 1 application topically 2 (two) times daily. (Patient not taking: Reported on 11/27/2019)   No facility-administered encounter medications on file as of 11/27/2019.    Allergies (verified) Patient has no known allergies.   History: Past Medical History:  Diagnosis Date  . Aneurysm, thoracic aortic ([place])   . CAD in native artery 08/14/2007   followed by cardiology  every six months.   . Cirrhosis ([place])   . Diverticulosis   . Gastric ulcer   . History of DVT (deep vein thrombosis)   . Hyperlipidemia   . Hypertension   . IBS (irritable bowel syndrome)    Past Surgical History:  Procedure Laterality Date  . CHOLECYSTECTOMY  1987  . ESOPHAGOGASTRODUODENOSCOPY   05/2009   Normal; [facility]  . Parathyroid Adenoma Resection     [facility]  . UPPER GASTROINTESTINAL ENDOSCOPY  02/2009   Mild HH, Mild GERD  . US CAROTID DOPPLER BILATERAL ([place] HX)  05/2009   minimal plaque formation. Symptom: near syncope   Family History  Problem Relation Age of Onset  . Stomach cancer Mother   . Stroke Father    Social History   Socioeconomic History  . Marital status: Widowed    Spouse name: Not on file  . Number of children: 5  . Years of education: Not on file  . Highest education level: High school graduate  Occupational History  . Occupation: Retired    Comment: Previously Tour manager for [employer]  . Smoking status: Never Smoker  . Smokeless tobacco: Never Used  Vaping Use  . Vaping Use: Never used  Substance and Sexual Activity  . Alcohol use: No    Alcohol/week: 0.0 standard drinks  . Drug use: No  . Sexual activity: Not on file  Other Topics Concern  . Not on file  Social History Narrative  . Not on file   Social Determinants of Health   Financial Resource Strain: Low Risk   . Difficulty of Paying Living Expenses: Not hard at all  Food Insecurity: No Food Insecurity  . Worried About Charity fundraiser in the Last Year: Never true  . Ran Out of Food in the Last Year: Never true  Transportation Needs: No Transportation Needs  . Lack of Transportation (Medical): No  . Lack of Transportation (Non-Medical): No  Physical Activity: Inactive  . Days of Exercise per Week: 0 days  . Minutes of Exercise per Session: 0 min  Stress: No Stress Concern Present  . Feeling of Stress : Not at all  Social Connections: Socially Isolated  . Frequency of Communication with Friends and Family: Twice a week  . Frequency of Social Gatherings with Friends and Family: More than three times a week  . Attends Religious Services: Never  . Active Member of Clubs or Organizations: No  . Attends Archivist Meetings: Never  .  Marital Status: Widowed    Tobacco Counseling Counseling given: Not Answered   Clinical Intake:  Pre-visit preparation completed: Yes  Pain : No/denies pain     Nutritional Risks: Nausea/ vomitting/ diarrhea (Diarrhea occasionally due to medication.) Diabetes: No  How often do you need to have someone help you when you read instructions, pamphlets, or other written materials from your doctor or pharmacy?: 5 - Always (Due to glaucoma and cataracts.)  Diabetic? No  Interpreter Needed?: No  Information entered by :: [facility], LPN   Activities of Daily Living In your present state of health, do you have any difficulty performing the following activities: 11/27/2019 05/20/2019  Hearing? Y Y  Comment Does not wear hearing aids. -  Vision? Y Y  Comment Due to Glaucoma and cataracts. Pt f/u with [facility]. -  Difficulty concentrating or making decisions? Tempie Donning  Walking or climbing stairs? N Y  Dressing or bathing? Y N  Comment Has assistance dressing  and bathing. -  Doing errands, shopping? Y Y  Comment Does not drive. -  Preparing Food and eating ? Y -  Comment Children manage meals. -  Using the Toilet? N -  In the past six months, have you accidently leaked urine? Y -  Comment Wears depends and night. -  Do you have problems with loss of bowel control? N -  Managing your Medications? Y -  Comment Son manages in medications. -  Managing your Finances? Y -  Comment Son manages finances. -  Housekeeping or managing your Housekeeping? Y -  Comment Pt does not clean as she lives with son. -  Some recent data might be hidden    Patient Care Team: [name], MD as PCP - General (Family Medicine) Yolonda Kida, MD as Consulting Physician (Cardiology) Lucky Cowboy Erskine Squibb, MD as Referring Physician (Vascular Surgery) Ok Edwards, NP as Nurse Practitioner (Gastroenterology) Hollice Espy, MD as Consulting Physician (Urology) Pa, [place]  (Optometry) Quay Burow, Wandra Feinstein, NP as Nurse Practitioner (Oncology)  Indicate any recent Medical Services you may have received from other than [facility] providers in the past year (date may be approximate).     Assessment:   This is a routine wellness examination for [place].  Hearing/Vision screen No exam data present  Dietary issues and exercise activities discussed: Current Exercise Habits: The patient does not participate in regular exercise at present, Exercise limited by: orthopedic condition(s);psychological condition(s)  Goals    . Increase water intake     Recommend increasing water intake to 4 glasses a day.     . Prevent falls     Recommend to remove any items from the home that may cause slips or trips.       Depression Screen PHQ 2/9 Scores 11/27/2019 08/26/2019 05/20/2019 12/29/2017 12/13/2016  PHQ - 2 Score 0 0 0 0 2  PHQ- 9 Score - 2 - - 3    Fall Risk Fall Risk  11/27/2019 08/26/2019 05/20/2019 12/29/2017 12/13/2016  Falls in the past year? 1 0 0 No No  Number falls in past yr: 0 0 0 - -  Injury with Fall? 0 0 0 - -  Risk for fall due to : - No Fall Risks - - -  Follow up Falls prevention discussed Falls evaluation completed Falls evaluation completed - -    Any stairs in or around the home? Yes  If so, are there any without handrails? No  Home free of loose throw rugs in walkways, pet beds, electrical cords, etc? Yes  Adequate lighting in your home to reduce risk of falls? Yes   ASSISTIVE DEVICES UTILIZED TO PREVENT FALLS:  Life alert? No  Use of a cane, walker or w/c? Yes  Grab bars in the bathroom? No  Shower chair or bench in shower? Yes  Elevated toilet seat or a handicapped toilet? Yes    Cognitive Function: Unable to complete due to speaking with son for visit.         Immunizations Immunization History  Administered Date(s) Administered  . Influenza, High Dose Seasonal PF 01/22/2016, 12/13/2016  . Pneumococcal Conjugate-13 12/05/2013    TDAP  status: Due, Education has been provided regarding the importance of this vaccine. Advised may receive this vaccine at local pharmacy or Health Dept. Aware to provide a copy of the vaccination record if obtained from local pharmacy or Health Dept. Verbalized acceptance and understanding. Flu Vaccine status: Due this fall.  Pneumococcal vaccine  status: Declined,  Education has been provided regarding the importance of this vaccine but patient still declined. Advised may receive this vaccine at local pharmacy or Health Dept. Aware to provide a copy of the vaccination record if obtained from local pharmacy or Health Dept. Verbalized acceptance and understanding.  Covid-19 vaccine status: Declined, Education has been provided regarding the importance of this vaccine but patient still declined. Advised may receive this vaccine at local pharmacy or Health Dept.or vaccine clinic. Aware to provide a copy of the vaccination record if obtained from local pharmacy or Health Dept. Verbalized acceptance and understanding.  Qualifies for Shingles Vaccine? Yes   Zostavax completed No   Shingrix Completed?: No.    Education has been provided regarding the importance of this vaccine. Patient has been advised to call insurance company to determine out of pocket expense if they have not yet received this vaccine. Advised may also receive vaccine at local pharmacy or Health Dept. Verbalized acceptance and understanding.  Screening Tests Health Maintenance  Topic Date Due  . COVID-19 Vaccine (1) Never done  . INFLUENZA VACCINE  10/20/2019  . TETANUS/TDAP  03/21/2026 (Originally 05/18/1951)  . PNA vac Low Risk Adult (2 of 2 - PPSV23) 03/21/2026 (Originally 12/06/2014)  . DEXA SCAN  02/06/2020    Health Maintenance  Health Maintenance Due  Topic Date Due  . COVID-19 Vaccine (1) Never done  . INFLUENZA VACCINE  10/20/2019    Colorectal cancer screening: No longer required.  Mammogram status: No longer required.   Bone Density status: Completed 02/05/18. Results reflect: Bone density results: OSTEOPOROSIS. Repeat every 2 years.  Lung Cancer Screening: (Low Dose CT Chest recommended if Age 52-80 years, 30 pack-year currently smoking OR have quit w/in 15years.) does not qualify.    Additional Screening:  Vision Screening: Recommended annual ophthalmology exams for early detection of glaucoma and other disorders of the eye. Is the patient up to date with their annual eye exam?  Yes  Who is the provider or what is the name of the office in which the patient attends annual eye exams? [facility] If pt is not established with a provider, would they like to be referred to a provider to establish care? No .   Dental Screening: Recommended annual dental exams for proper oral hygiene  Community Resource Referral / Chronic Care Management: CRR required this visit?  No   CCM required this visit?  No      Plan:     I have personally reviewed and noted the following in the patient's chart:   . Medical and social history . Use of alcohol, tobacco or illicit drugs  . Current medications and supplements . Functional ability and status . Nutritional status . Physical activity . Advanced directives . List of other physicians . Hospitalizations, surgeries, and ER visits in previous 12 months . Vitals . Screenings to include cognitive, depression, and falls . Referrals and appointments  In addition, I have reviewed and discussed with patient certain preventive protocols, quality metrics, and best practice recommendations. A written personalized care plan for preventive services as well as general preventive health recommendations were provided to patient.     Krystal Delduca [place], [place]   06/26/2498   Nurse Notes: Pt to receive her flu shot and Covid vaccine at next in office apt. Son would like to discuss the Pneumovax 23 vaccine at next apt too.

## 2019-11-27 ENCOUNTER — Telehealth: Payer: Self-pay

## 2019-11-27 ENCOUNTER — Other Ambulatory Visit: Payer: Self-pay

## 2019-11-27 ENCOUNTER — Ambulatory Visit (INDEPENDENT_AMBULATORY_CARE_PROVIDER_SITE_OTHER): Payer: Medicare Other

## 2019-11-27 DIAGNOSIS — Z0000 Encounter for general adult medical examination without abnormal findings: Secondary | ICD-10-CM

## 2019-11-27 NOTE — Telephone Encounter (Signed)
Copied from [place] [id number]. Topic: General - Other >> Nov 27, 2019 10:40 AM Keene Breath wrote: Reason for CRM: Patient's family is returning a call to [facility].  Please call Evie Croston to discuss POA paperwork as well.  CB# [phone]

## 2019-11-27 NOTE — Patient Instructions (Addendum)
Terri Bird , Thank you for taking time to come for your Medicare Wellness Visit. I appreciate your ongoing commitment to your health goals. Please review the following plan we discussed and let me know if I can assist you in the future.   Screening recommendations/referrals: Colonoscopy: No longer required.  Mammogram: No longer required.  Bone Density: Up to date, due 01/2020 Recommended yearly ophthalmology/optometry visit for glaucoma screening and checkup Recommended yearly dental visit for hygiene and checkup  Vaccinations: Influenza vaccine: Due this fall.  Pneumococcal vaccine: Pneumovax 23 due. Son to discuss with PCP at next in office apt.  Tdap vaccine: Currently due. Declined vaccine at this time. Shingles vaccine: Shingrix discussed. Please contact your pharmacy for coverage information.     Advanced directives: Advance directive discussed with you today. Even though you declined this today please call our office should you change your mind and we can give you the proper paperwork for you to fill out.  Conditions/risks identified: Fall risk preventatives discussed today.   Next appointment: 12/23/19 @ 8:20 AM with Dr Caryn Section.    Preventive Care 20 Years and Older, Female Preventive care refers to lifestyle choices and visits with your health care provider that can promote health and wellness. What does preventive care include?  A yearly physical exam. This is also called an annual well check.  Dental exams once or twice a year.  Routine eye exams. Ask your health care provider how often you should have your eyes checked.  Personal lifestyle choices, including:  Daily care of your teeth and gums.  Regular physical activity.  Eating a healthy diet.  Avoiding tobacco and drug use.  Limiting alcohol use.  Practicing safe sex.  Taking low-dose aspirin every day.  Taking vitamin and mineral supplements as recommended by your health care provider. What happens  during an annual well check? The services and screenings done by your health care provider during your annual well check will depend on your age, overall health, lifestyle risk factors, and family history of disease. Counseling  Your health care provider may ask you questions about your:  Alcohol use.  Tobacco use.  Drug use.  Emotional well-being.  Home and relationship well-being.  Sexual activity.  Eating habits.  History of falls.  Memory and ability to understand (cognition).  Work and work Statistician.  Reproductive health. Screening  You may have the following tests or measurements:  Height, weight, and BMI.  Blood pressure.  Lipid and cholesterol levels. These may be checked every 5 years, or more frequently if you are over 9 years old.  Skin check.  Lung cancer screening. You may have this screening every year starting at age 56 if you have a 30-pack-year history of smoking and currently smoke or have quit within the past 15 years.  Fecal occult blood test (FOBT) of the stool. You may have this test every year starting at age 51.  Flexible sigmoidoscopy or colonoscopy. You may have a sigmoidoscopy every 5 years or a colonoscopy every 10 years starting at age 79.  Hepatitis C blood test.  Hepatitis B blood test.  Sexually transmitted disease (STD) testing.  Diabetes screening. This is done by checking your blood sugar (glucose) after you have not eaten for a while (fasting). You may have this done every 1-3 years.  Bone density scan. This is done to screen for osteoporosis. You may have this done starting at age 49.  Mammogram. This may be done every 1-2 years. Talk to your  health care provider about how often you should have regular mammograms. Talk with your health care provider about your test results, treatment options, and if necessary, the need for more tests. Vaccines  Your health care provider may recommend certain vaccines, such  as:  Influenza vaccine. This is recommended every year.  Tetanus, diphtheria, and acellular pertussis (Tdap, Td) vaccine. You may need a Td booster every 10 years.  Zoster vaccine. You may need this after age 93.  Pneumococcal 13-valent conjugate (PCV13) vaccine. One dose is recommended after age 80.  Pneumococcal polysaccharide (PPSV23) vaccine. One dose is recommended after age 53. Talk to your health care provider about which screenings and vaccines you need and how often you need them. This information is not intended to replace advice given to you by your health care provider. Make sure you discuss any questions you have with your health care provider. Document Released: 04/03/2015 Document Revised: 11/25/2015 Document Reviewed: 01/06/2015 Elsevier Interactive Patient Education  2017 [place] Prevention in the Home Falls can cause injuries. They can happen to people of all ages. There are many things you can do to make your home safe and to help prevent falls. What can I do on the outside of my home?  Regularly fix the edges of walkways and driveways and fix any cracks.  Remove anything that might make you trip as you walk through a door, such as a raised step or threshold.  Trim any bushes or trees on the path to your home.  Use bright outdoor lighting.  Clear any walking paths of anything that might make someone trip, such as rocks or tools.  Regularly check to see if handrails are loose or broken. Make sure that both sides of any steps have handrails.  Any raised decks and porches should have guardrails on the edges.  Have any leaves, snow, or ice cleared regularly.  Use sand or salt on walking paths during winter.  Clean up any spills in your garage right away. This includes oil or grease spills. What can I do in the bathroom?  Use night lights.  Install grab bars by the toilet and in the tub and shower. Do not use towel bars as grab bars.  Use  non-skid mats or decals in the tub or shower.  If you need to sit down in the shower, use a plastic, non-slip stool.  Keep the floor dry. Clean up any water that spills on the floor as soon as it happens.  Remove soap buildup in the tub or shower regularly.  Attach bath mats securely with double-sided non-slip rug tape.  Do not have throw rugs and other things on the floor that can make you trip. What can I do in the bedroom?  Use night lights.  Make sure that you have a light by your bed that is easy to reach.  Do not use any sheets or blankets that are too big for your bed. They should not hang down onto the floor.  Have a firm chair that has side arms. You can use this for support while you get dressed.  Do not have throw rugs and other things on the floor that can make you trip. What can I do in the kitchen?  Clean up any spills right away.  Avoid walking on wet floors.  Keep items that you use a lot in easy-to-reach places.  If you need to reach something above you, use a strong step stool that has a  grab bar.  Keep electrical cords out of the way.  Do not use floor polish or wax that makes floors slippery. If you must use wax, use non-skid floor wax.  Do not have throw rugs and other things on the floor that can make you trip. What can I do with my stairs?  Do not leave any items on the stairs.  Make sure that there are handrails on both sides of the stairs and use them. Fix handrails that are broken or loose. Make sure that handrails are as long as the stairways.  Check any carpeting to make sure that it is firmly attached to the stairs. Fix any carpet that is loose or worn.  Avoid having throw rugs at the top or bottom of the stairs. If you do have throw rugs, attach them to the floor with carpet tape.  Make sure that you have a light switch at the top of the stairs and the bottom of the stairs. If you do not have them, ask someone to add them for you. What  else can I do to help prevent falls?  Wear shoes that:  Do not have high heels.  Have rubber bottoms.  Are comfortable and fit you well.  Are closed at the toe. Do not wear sandals.  If you use a stepladder:  Make sure that it is fully opened. Do not climb a closed stepladder.  Make sure that both sides of the stepladder are locked into place.  Ask someone to hold it for you, if possible.  Clearly mark and make sure that you can see:  Any grab bars or handrails.  First and last steps.  Where the edge of each step is.  Use tools that help you move around (mobility aids) if they are needed. These include:  Canes.  Walkers.  Scooters.  Crutches.  Turn on the lights when you go into a dark area. Replace any light bulbs as soon as they burn out.  Set up your furniture so you have a clear path. Avoid moving your furniture around.  If any of your floors are uneven, fix them.  If there are any pets around you, be aware of where they are.  Review your medicines with your doctor. Some medicines can make you feel dizzy. This can increase your chance of falling. Ask your doctor what other things that you can do to help prevent falls. This information is not intended to replace advice given to you by your health care provider. Make sure you discuss any questions you have with your health care provider. Document Released: 01/01/2009 Document Revised: 08/13/2015 Document Reviewed: 04/11/2014 Elsevier Interactive Patient Education  2017 [employer].

## 2019-11-29 ENCOUNTER — Ambulatory Visit
Admission: RE | Admit: 2019-11-29 | Discharge: 2019-11-29 | Disposition: A | Discharge status: Home / Self Care | Payer: Medicare Other | Source: Ambulatory Visit | Attending: Oncology | Admitting: Oncology

## 2019-11-29 ENCOUNTER — Other Ambulatory Visit: Payer: Self-pay

## 2019-11-29 DIAGNOSIS — R911 Solitary pulmonary nodule: Secondary | ICD-10-CM | POA: Diagnosis not present

## 2019-11-29 DIAGNOSIS — I2510 Atherosclerotic heart disease of native coronary artery without angina pectoris: Secondary | ICD-10-CM | POA: Diagnosis not present

## 2019-11-29 DIAGNOSIS — I700 Atherosclerosis of aorta: Secondary | ICD-10-CM | POA: Diagnosis not present

## 2019-11-29 DIAGNOSIS — R918 Other nonspecific abnormal finding of lung field: Secondary | ICD-10-CM | POA: Diagnosis not present

## 2019-11-29 DIAGNOSIS — J929 Pleural plaque without asbestos: Secondary | ICD-10-CM | POA: Diagnosis not present

## 2019-12-03 DIAGNOSIS — R1013 Epigastric pain: Secondary | ICD-10-CM | POA: Diagnosis not present

## 2019-12-03 DIAGNOSIS — K2970 Gastritis, unspecified, without bleeding: Secondary | ICD-10-CM | POA: Diagnosis not present

## 2019-12-03 DIAGNOSIS — K7460 Unspecified cirrhosis of liver: Secondary | ICD-10-CM | POA: Diagnosis not present

## 2019-12-05 ENCOUNTER — Inpatient Hospital Stay: Payer: Medicare Other | Attending: Oncology | Admitting: Oncology

## 2019-12-05 ENCOUNTER — Other Ambulatory Visit: Payer: Self-pay

## 2019-12-05 DIAGNOSIS — R911 Solitary pulmonary nodule: Secondary | ICD-10-CM

## 2019-12-05 NOTE — Progress Notes (Signed)
Pulmonary Nodule Clinic Consult note [facility]  [phone] [phone]  Patient Care Team: [name], MD as PCP - General (Family Medicine) Yolonda Kida, MD as Consulting Physician (Cardiology) Lucky Cowboy Erskine Squibb, MD as Referring Physician (Vascular Surgery) Ok Edwards, NP as Nurse Practitioner (Gastroenterology) Hollice Espy, MD as Consulting Physician (Urology) [facility], [facility] (Optometry) Jacquelin Hawking, NP as Nurse Practitioner (Oncology)   Name of the patient: Terri Bird  [id number]  [birth year]   Date of visit: 12/05/2019   Diagnosis- Lung Nodule  Virtual Visit via Telephone Note   I connected with Terri Bird  on 12/06/19 at  by telephone visit and verified that I am speaking with the correct person using two identifiers.   I discussed the limitations, risks, security and privacy concerns of performing an evaluation and management service by telemedicine and the availability of in-person appointments. I also discussed with the patient that there may be a patient responsible charge related to this service. The patient expressed understanding and agreed to proceed.   Other persons participating in the visit and their role in the encounter: Daughter in law  Patient's location: Home  Provider's location: Clinic   Chief complaint/ Reason for visit- Pulmonary Nodule Clinic Initial Visit  Past Medical History:  Terri Bird is an 86 year old female with past medical history significant for CAD, PE, hypertension, MI, stroke, hepatic cirrhosis, IBS, pancreatitis, osteoporosis who is been followed by Dr. Grayland Ormond for a lung nodule. Most recent CT chest from 04/29/2019 showed 2 right upper lobe pulmonary nodules measuring 2 to 3 mm that were likely benign findings. Recommended chest CT in approximately 6 months per Fleischner's guidelines.  She was also found to have several bilateral renal lesions and a  new left breast mass. Patient was scheduled to have a mammogram which I do not believe was ever completed.  Interval history-[name] presents today to discuss recent CT scan results.  [name] is a current non-smoker and has never smoked.  Admits to secondhand smoke from her husband and oldest son.  Admits to occupational exposure.  Worked for over 50 years in a [employer].  Retired many many years ago.  Denies any face coverings.  Currently she is doing well.  She lives with her daughter-in-law and son.  She is able to do most of her ADLs but needs help with ambulation.  She uses a cane or walker.  She denies any respiratory concerns.  She is followed by Dr. Erlene Quan for a kidney cyst.  She was deemed not to be a surgical candidate given her comorbidities.  She receives annual renal ultrasounds.  Stable at this time.  No personal history of cancer.   Denies any neurologic complaints. Denies recent fevers or illnesses. Denies any easy bleeding or bruising. Reports good appetite and denies weight loss. Denies chest pain. Denies any nausea, vomiting, constipation, or diarrhea. Denies urinary complaints. Patient offers no further specific complaints today.   ECOG FS:1 - Symptomatic but completely ambulatory  Review of systems- Review of Systems  Constitutional: Positive for malaise/fatigue and weight loss. Negative for chills and fever.  HENT: Negative for congestion, ear pain and tinnitus.   Eyes: Positive for blurred vision. Negative for double vision.  Respiratory: Negative.  Negative for cough, sputum production and shortness of breath.   Cardiovascular: Negative.  Negative for chest pain, palpitations and leg swelling.  Gastrointestinal: Negative.  Negative for abdominal pain, constipation, diarrhea, nausea and vomiting.  Genitourinary: Negative for dysuria, frequency and urgency.  Musculoskeletal: Negative for back pain and falls.  Skin: Negative.  Negative for rash.    Neurological: Positive for weakness. Negative for headaches.  Endo/Heme/Allergies: Negative.  Does not bruise/bleed easily.  Psychiatric/Behavioral: Positive for memory loss. Negative for depression. The patient is not nervous/anxious and does not have insomnia.      No Known Allergies   Past Medical History:  Diagnosis Date  . Aneurysm, thoracic aortic ([place])   . CAD in native artery 08/14/2007   followed by cardiology every six months.   . Cirrhosis ([place])   . Diverticulosis   . Gastric ulcer   . History of DVT (deep vein thrombosis)   . Hyperlipidemia   . Hypertension   . IBS (irritable bowel syndrome)      Past Surgical History:  Procedure Laterality Date  . CHOLECYSTECTOMY  1987  . ESOPHAGOGASTRODUODENOSCOPY  05/2009   Normal; [facility]  . Parathyroid Adenoma Resection     [facility]  . UPPER GASTROINTESTINAL ENDOSCOPY  02/2009   Mild HH, Mild GERD  . US CAROTID DOPPLER BILATERAL ([place] [facility])  05/2009   minimal plaque formation. Symptom: near syncope    Social History   Socioeconomic History  . Marital status: Widowed    Spouse name: Not on file  . Number of children: 5  . Years of education: Not on file  . Highest education level: High school graduate  Occupational History  . Occupation: Retired    Comment: Previously Tour manager for [employer]  . Smoking status: Never Smoker  . Smokeless tobacco: Never Used  Vaping Use  . Vaping Use: Never used  Substance and Sexual Activity  . Alcohol use: No    Alcohol/week: 0.0 standard drinks  . Drug use: No  . Sexual activity: Not on file  Other Topics Concern  . Not on file  Social History Narrative  . Not on file   Social Determinants of Health   Financial Resource Strain: Low Risk   . Difficulty of Paying Living Expenses: Not hard at all  Food Insecurity: No Food Insecurity  . Worried About Charity fundraiser in the Last Year: Never true  . Ran Out of Food in the Last Year: Never true   Transportation Needs: No Transportation Needs  . Lack of Transportation (Medical): No  . Lack of Transportation (Non-Medical): No  Physical Activity: Inactive  . Days of Exercise per Week: 0 days  . Minutes of Exercise per Session: 0 min  Stress: No Stress Concern Present  . Feeling of Stress : Not at all  Social Connections: Socially Isolated  . Frequency of Communication with Friends and Family: Twice a week  . Frequency of Social Gatherings with Friends and Family: More than three times a week  . Attends Religious Services: Never  . Active Member of Clubs or Organizations: No  . Attends Archivist Meetings: Never  . Marital Status: Widowed  Intimate Partner Violence: Not At Risk  . Fear of Current or Ex-Partner: No  . Emotionally Abused: No  . Physically Abused: No  . Sexually Abused: No    Family History  Problem Relation Age of Onset  . Stomach cancer Mother   . Stroke Father      Current Outpatient Medications:  .  amLODipine (NORVASC) 5 MG tablet, TAKE 1 TABLET BY MOUTH EVERY DAY, Disp: 90 tablet, Rfl: 1 .  aspirin 81 MG tablet, Take 81 mg  by mouth daily., Disp: , Rfl:  .  atorvastatin (LIPITOR) 20 MG tablet, TAKE ONE (1) TABLET EACH DAY, Disp: 90 tablet, Rfl: 4 .  brimonidine (ALPHAGAN) 0.2 % ophthalmic solution, Apply 1 drop to eye 2 (two) times daily., Disp: , Rfl:  .  Cholecalciferol (VITAMIN D3) 50 MCG (2000 UT) capsule, Take 2,000 Units by mouth daily., Disp: , Rfl:  .  dicyclomine (BENTYL) 10 MG capsule, Take 1 capsule (10 mg total) by mouth every 6 (six) hours as needed (abdominal pain). (Patient not taking: Reported on 11/27/2019), Disp: 60 capsule, Rfl: 2 .  dorzolamide-timolol (COSOPT) 22.3-6.8 MG/ML ophthalmic solution, Place 1 drop into both eyes 2 (two) times daily., Disp: , Rfl:  .  lactulose (CHRONULAC) 10 GM/15ML solution, Take 15 mLs (10 g total) by mouth 2 (two) times daily as needed for mild constipation., Disp: 900 mL, Rfl: 3 .  latanoprost  (XALATAN) 0.005 % ophthalmic solution, Place 1 drop into both eyes at bedtime., Disp: , Rfl:  .  lisinopril (ZESTRIL) 20 MG tablet, TAKE 1 TABLET BY MOUTH EVERY DAY (Patient not taking: Reported on 11/27/2019), Disp: 90 tablet, Rfl: 1 .  metoprolol (LOPRESSOR) 100 MG tablet, TAKE ONE TABLET TWICE DAILY (Patient not taking: Reported on 11/27/2019), Disp: 60 tablet, Rfl: 11 .  nitroGLYCERIN (NITROSTAT) 0.4 MG SL tablet, 0.4 mg. (Patient not taking: Reported on 11/27/2019), Disp: , Rfl:  .  pantoprazole (PROTONIX) 40 MG tablet, Take 40 mg by mouth daily., Disp: , Rfl:  .  rifaximin (XIFAXAN) 550 MG TABS tablet, Take 1 tablet (550 mg total) by mouth 2 (two) times daily., Disp: 180 tablet, Rfl: 2 .  traMADol-acetaminophen (ULTRACET) 37.5-325 MG tablet, Take 1 tablet by mouth every 8 (eight) hours as needed.  (Patient not taking: Reported on 11/27/2019), Disp: , Rfl:  .  triamcinolone lotion (KENALOG) 0.1 %, Apply 1 application topically 2 (two) times daily. (Patient not taking: Reported on 11/27/2019), Disp: 60 mL, Rfl: 3  Physical exam: There were no vitals filed for this visit. Limited secondary to virtual visit.  CMP Latest Ref Rng & Units 08/26/2019  Glucose 65 - 99 mg/dL 92  BUN 8 - 27 mg/dL 19  Creatinine 0.57 - 1.00 mg/dL 1.31(H)  Sodium 134 - 144 mmol/L 141  Potassium 3.5 - 5.2 mmol/L 4.0  Chloride 96 - 106 mmol/L 111(H)  CO2 20 - 29 mmol/L 16(L)  Calcium 8.7 - 10.3 mg/dL 9.3  Total Protein 6.0 - 8.5 g/dL 7.1  Total Bilirubin 0.0 - 1.2 mg/dL 1.0  Alkaline Phos 48 - 121 IU/L 127(H)  AST 0 - 40 IU/L 17  ALT 0 - 32 IU/L 5   CBC Latest Ref Rng & Units 08/26/2019  WBC 3.4 - 10.8 x10E3/uL 5.0  Hemoglobin 11.1 - 15.9 g/dL 11.8  Hematocrit 34.0 - 46.6 % 36.2  Platelets 150 - 450 x10E3/uL 80(LL)    No images are attached to the encounter.  CT Chest Wo Contrast  Result Date: 11/29/2019 CLINICAL DATA:  86 year old female with history of pulmonary nodule. Followup study. EXAM: CT CHEST WITHOUT  CONTRAST TECHNIQUE: Multidetector CT imaging of the chest was performed following the standard protocol without IV contrast. COMPARISON:  Chest CT 04/29/2019. FINDINGS: Cardiovascular: Heart size is normal. There is no significant pericardial fluid, thickening or pericardial calcification. There is aortic atherosclerosis, as well as atherosclerosis of the great vessels of the mediastinum and the coronary arteries, including calcified atherosclerotic plaque in the left main, left anterior descending, left circumflex and right  coronary arteries. Aortic endograft in the descending thoracic aorta. Mediastinum/Nodes: No pathologically enlarged mediastinal or hilar lymph nodes. Please note that accurate exclusion of hilar adenopathy is limited on noncontrast CT scans. Esophagus is unremarkable in appearance. No axillary lymphadenopathy. Lungs/Pleura: Tiny 3 mm right upper lobe pulmonary nodule (axial image 49 of series 3), stable compared to the prior study. In the periphery of the right upper lobe abutting the minor fissure (axial image 72 of series 3) there is a nodular architectural distortion measuring 8 x 4 mm (mean diameter of 6 mm), which is stable compared to the prior examination. No other larger more suspicious appearing pulmonary nodules or masses are noted. No acute consolidative airspace disease. No pleural effusions. Scattered areas of mild peripheral predominant septal thickening and subpleural reticulation, most evident in the lung bases bilaterally. Upper Abdomen: Multiple renal lesions bilaterally of varying degrees of complexity, similar to prior studies, ranging from low to intermediate to high attenuation, incompletely characterized on today's non-contrast CT examination, but statistically likely to represent a combination of cysts and proteinaceous/hemorrhagic cysts, including one high attenuation lesion with some peripheral rim calcification in the lateral aspect of the interpolar region of the  right kidney measuring 5.0 x 4.5 cm, which is similar to prior examinations. Musculoskeletal: There are no aggressive appearing lytic or blastic lesions noted in the visualized portions of the skeleton. IMPRESSION: 1. Small pulmonary nodules in the right lung, stable compared to the prior study from 04/29/2019. These are favored to be benign. One additional follow-up noncontrast chest CT could be considered in 12 months to ensure continued stability if clinically appropriate. 2. Subtle changes in the periphery of the lung bases bilaterally which likely reflect mild or early interstitial lung disease. Outpatient referral to Pulmonology for further clinical evaluation should be considered. 3. Aortic atherosclerosis, in addition to left main and 3 vessel coronary artery disease. 4. Additional incidental findings, as above, similar to prior studies. Aortic Atherosclerosis (ICD10-I70.0). Electronically Signed   By: Vinnie Langton M.D.   On: 11/29/2019 15:17     Assessment and plan- Patient is a 86 y.o. female who presents to pulmonary nodule clinic for follow-up of incidental lung nodules.  A telephone visit was conducted to review most recent CT scan results.    CT chest without contrast from 11/29/2019 shows small pulmonary nodules in right lung that are stable when compared to exam from 04/29/2019.  These are favored to be benign.  Follow-up with noncontrast chest CT in 12 months to ensure stability.  Subtle changes in the periphery of the lung bases bilaterally which may reflect mild or early interstitial lung disease.  Outpatient referral to pulmonology should be considered.   Calculating malignancy probability of a pulmonary nodule: Risk factors include: 1.  Age. 2.  Cancer history. 3.  Diameter of pulmonary nodule and mm 4.  Location 5.  Smoking history 6.  Spiculation present   Based on risk factors, this patient is moderate  risk for the development of lung cancer.  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   New findings of early interstitial lung disease.  Will make referral to pulmonology.   During our visit, we discussed pulmonary nodules are a common incidental finding and are often how lung cancer is discovered.  Lung cancer survival is directly related to the stage at diagnosis.  We discussed that nodules can vary in presentation from solitary  pulmonary nodules to masses, 2 groundglass opacities and multiple nodules.  Pulmonary nodules in the majority of cases are benign but the probability of these becoming malignant cannot be undermined.  Early identification of malignant nodules could lead to early diagnosis and increased survival.   We discussed the probability of pulmonary nodules becoming malignant increase with age, pack years of tobacco use, size/characteristics of the nodule and location; with upper lobe involvement being most worrisome.   We discussed the goal of our clinic is to thoroughly evaluate each nodule, developed a comprehensive, individualized plan of care utilizing the most advanced technology and significantly reduce the time from detection to treatment.  A dedicated pulmonary nodule clinic has proven to indeed expedite the detection and treatment of lung cancer.   Patient education in fact sheet provided along with most recent CT scans.  Plan: Discuss recent CT chest. Review personal and family history. Reviewed smoking history. Referral to pulmonology. Orders placed for repeat CT chest.  Disposition Repeat CT chest in 1 year and follow-up in lung nodule clinic a few days later.   Referral to pulmonology    Visit Diagnosis 1. Lung nodule     Patient expressed understanding and was in agreement with this plan. She also understands that She can call clinic at any time with any questions, concerns, or complaints.   Greater than 50% was  spent in counseling and coordination of care with this patient including but not limited to discussion of the relevant topics above (See A&P) including, but not limited to diagnosis and management of acute and chronic medical conditions.   Thank you for allowing me to participate in the care of this very pleasant patient.    Jacquelin Hawking, NP [place] [facility] Cell - [phone] Pager- [phone] 12/06/2019 9:02 AM

## 2019-12-16 ENCOUNTER — Telehealth: Payer: Self-pay

## 2019-12-16 NOTE — Telephone Encounter (Signed)
Lida with HR dept with [facility] will refax the form question 9 she needs end date

## 2019-12-16 NOTE — Telephone Encounter (Signed)
Copied from [place] [id number]. Topic: General - Inquiry >> Dec 13, 2019  4:43 PM Terri Bird wrote: Reason for CRM: Pt's daughter in law called saying her husband Terri Bird had FMLA papers filled out by Dr. Caryn Section for his mother.  She said his HR at work needs a more specific time line for him FMLA.  Daughter in law ask for one year.  This question is on page 4 question number 9.  His HR will be calling next week.  This is just an [place]

## 2019-12-17 NOTE — Telephone Encounter (Signed)
Forms received and placed in Dr. Maralyn Sago box. TNP

## 2019-12-19 ENCOUNTER — Telehealth: Payer: Self-pay | Admitting: Family Medicine

## 2019-12-19 NOTE — Telephone Encounter (Signed)
Called to get verification from the nurse that the doctor did fill out question 9 of the medical certification form that was sent to the office.  CB# [phone]

## 2019-12-21 DIAGNOSIS — R1013 Epigastric pain: Secondary | ICD-10-CM | POA: Diagnosis not present

## 2019-12-21 DIAGNOSIS — K7460 Unspecified cirrhosis of liver: Secondary | ICD-10-CM | POA: Diagnosis not present

## 2019-12-21 DIAGNOSIS — N183 Chronic kidney disease, stage 3 unspecified: Secondary | ICD-10-CM | POA: Diagnosis not present

## 2019-12-21 DIAGNOSIS — Z20822 Contact with and (suspected) exposure to covid-19: Secondary | ICD-10-CM | POA: Diagnosis not present

## 2019-12-21 DIAGNOSIS — D696 Thrombocytopenia, unspecified: Secondary | ICD-10-CM | POA: Diagnosis not present

## 2019-12-21 DIAGNOSIS — I714 Abdominal aortic aneurysm, without rupture: Secondary | ICD-10-CM | POA: Diagnosis not present

## 2019-12-21 DIAGNOSIS — Z723 Lack of physical exercise: Secondary | ICD-10-CM | POA: Diagnosis not present

## 2019-12-21 DIAGNOSIS — R55 Syncope and collapse: Secondary | ICD-10-CM | POA: Diagnosis not present

## 2019-12-21 DIAGNOSIS — F0390 Unspecified dementia without behavioral disturbance: Secondary | ICD-10-CM | POA: Diagnosis not present

## 2019-12-21 DIAGNOSIS — R4182 Altered mental status, unspecified: Secondary | ICD-10-CM | POA: Diagnosis not present

## 2019-12-22 DIAGNOSIS — N281 Cyst of kidney, acquired: Secondary | ICD-10-CM | POA: Diagnosis not present

## 2019-12-22 DIAGNOSIS — R55 Syncope and collapse: Secondary | ICD-10-CM | POA: Diagnosis not present

## 2019-12-23 ENCOUNTER — Other Ambulatory Visit: Payer: Self-pay

## 2019-12-23 ENCOUNTER — Telehealth (INDEPENDENT_AMBULATORY_CARE_PROVIDER_SITE_OTHER): Payer: Medicare Other | Admitting: Family Medicine

## 2019-12-23 DIAGNOSIS — Z7901 Long term (current) use of anticoagulants: Secondary | ICD-10-CM | POA: Diagnosis not present

## 2019-12-23 DIAGNOSIS — I129 Hypertensive chronic kidney disease with stage 1 through stage 4 chronic kidney disease, or unspecified chronic kidney disease: Secondary | ICD-10-CM | POA: Diagnosis present

## 2019-12-23 DIAGNOSIS — K589 Irritable bowel syndrome without diarrhea: Secondary | ICD-10-CM

## 2019-12-23 DIAGNOSIS — Z86711 Personal history of pulmonary embolism: Secondary | ICD-10-CM

## 2019-12-23 DIAGNOSIS — R4182 Altered mental status, unspecified: Secondary | ICD-10-CM | POA: Diagnosis present

## 2019-12-23 DIAGNOSIS — I2510 Atherosclerotic heart disease of native coronary artery without angina pectoris: Secondary | ICD-10-CM | POA: Diagnosis present

## 2019-12-23 DIAGNOSIS — M545 Low back pain, unspecified: Secondary | ICD-10-CM

## 2019-12-23 DIAGNOSIS — K7460 Unspecified cirrhosis of liver: Secondary | ICD-10-CM | POA: Diagnosis present

## 2019-12-23 DIAGNOSIS — G459 Transient cerebral ischemic attack, unspecified: Secondary | ICD-10-CM | POA: Diagnosis not present

## 2019-12-23 DIAGNOSIS — I080 Rheumatic disorders of both mitral and aortic valves: Secondary | ICD-10-CM | POA: Diagnosis present

## 2019-12-23 DIAGNOSIS — I712 Thoracic aortic aneurysm, without rupture: Secondary | ICD-10-CM | POA: Diagnosis present

## 2019-12-23 DIAGNOSIS — Z7982 Long term (current) use of aspirin: Secondary | ICD-10-CM | POA: Diagnosis not present

## 2019-12-23 DIAGNOSIS — I6521 Occlusion and stenosis of right carotid artery: Secondary | ICD-10-CM | POA: Diagnosis present

## 2019-12-23 DIAGNOSIS — Z955 Presence of coronary angioplasty implant and graft: Secondary | ICD-10-CM | POA: Diagnosis not present

## 2019-12-23 DIAGNOSIS — N281 Cyst of kidney, acquired: Secondary | ICD-10-CM | POA: Diagnosis present

## 2019-12-23 DIAGNOSIS — E860 Dehydration: Secondary | ICD-10-CM | POA: Diagnosis present

## 2019-12-23 DIAGNOSIS — I714 Abdominal aortic aneurysm, without rupture: Secondary | ICD-10-CM | POA: Diagnosis present

## 2019-12-23 DIAGNOSIS — D696 Thrombocytopenia, unspecified: Secondary | ICD-10-CM | POA: Diagnosis present

## 2019-12-23 DIAGNOSIS — Z8673 Personal history of transient ischemic attack (TIA), and cerebral infarction without residual deficits: Secondary | ICD-10-CM | POA: Diagnosis not present

## 2019-12-23 DIAGNOSIS — G8929 Other chronic pain: Secondary | ICD-10-CM

## 2019-12-23 DIAGNOSIS — E785 Hyperlipidemia, unspecified: Secondary | ICD-10-CM | POA: Diagnosis present

## 2019-12-23 DIAGNOSIS — Z86718 Personal history of other venous thrombosis and embolism: Secondary | ICD-10-CM | POA: Diagnosis not present

## 2019-12-23 DIAGNOSIS — I959 Hypotension, unspecified: Secondary | ICD-10-CM | POA: Diagnosis present

## 2019-12-23 DIAGNOSIS — F0390 Unspecified dementia without behavioral disturbance: Secondary | ICD-10-CM | POA: Diagnosis present

## 2019-12-23 DIAGNOSIS — I77810 Thoracic aortic ectasia: Secondary | ICD-10-CM | POA: Diagnosis not present

## 2019-12-23 DIAGNOSIS — N183 Chronic kidney disease, stage 3 unspecified: Secondary | ICD-10-CM | POA: Diagnosis present

## 2019-12-23 DIAGNOSIS — Z20822 Contact with and (suspected) exposure to covid-19: Secondary | ICD-10-CM | POA: Diagnosis present

## 2019-12-23 DIAGNOSIS — R55 Syncope and collapse: Secondary | ICD-10-CM | POA: Diagnosis present

## 2019-12-23 MED ORDER — TRAMADOL-ACETAMINOPHEN 37.5-325 MG PO TABS
1.0000 | ORAL_TABLET | Freq: Three times a day (TID) | ORAL | 1 refills | Status: AC | PRN
Start: 2019-12-23 — End: ?

## 2019-12-23 MED ORDER — DICYCLOMINE HCL 10 MG PO CAPS: 10.0000 mg | ORAL_CAPSULE | Freq: Four times a day (QID) | ORAL | 2 refills | Status: AC | PRN

## 2019-12-23 MED ORDER — ATORVASTATIN CALCIUM 20 MG PO TABS: ORAL_TABLET | ORAL | 4 refills | Status: AC

## 2019-12-23 MED ORDER — NITROGLYCERIN 0.4 MG SL SUBL: 0.4000 mg | SUBLINGUAL_TABLET | SUBLINGUAL | 1 refills | Status: AC | PRN

## 2019-12-23 NOTE — Progress Notes (Signed)
Virtual telephone visit    Virtual Visit via Telephone Note   This visit type was conducted due to national recommendations for restrictions regarding the COVID-19 Pandemic (e.g. social distancing) in an effort to limit this patient's exposure and mitigate transmission in our community. Due to her co-morbid illnesses, this patient is at least at moderate risk for complications without adequate follow up. This format is felt to be most appropriate for this patient at this time. The patient did not have access to video technology or had technical difficulties with video requiring transitioning to audio format only (telephone). Physical exam was limited to content and character of the telephone converstion.    Patient location: [facility] medical center Provider location: [facility]  I discussed the limitations of evaluation and management by telemedicine and the availability of in person appointments. The patient expressed understanding and agreed to proceed.   Visit Date: 12/23/2019  Today's healthcare provider: Lelon Huh, MD   Chief Complaint  Patient presents with  . Follow-up   Subjective    HPI  Follow up ER visit  Patient was seen in ER for syncope on 12/21/2019. She reports good compliance with treatment. She reports this condition is stable. Patient has moved to [place] with her son and daughter in law Marchia Bond and Maybree Riling.  Patient and family are confused about the medications she is supposed to be taking. She has history hypertension and CAD. She also has history hepatic cirrhosis and history of PE. Family reports patient has several episodes where she becomes unresponsive for which she was taking the [facility] ER and admitted. She was given some IV fluids. She was ruled out for MI and is apparently undergoing evaluation for possible TIAs, as she does also have CVA history.  -----------------------------------------------------------------------------------------        Medications: Outpatient Medications Prior to Visit  Medication Sig  . amLODipine (NORVASC) 5 MG tablet TAKE 1 TABLET BY MOUTH EVERY DAY  . aspirin 81 MG tablet Take 81 mg by mouth daily.  . brimonidine (ALPHAGAN) 0.2 % ophthalmic solution Apply 1 drop to eye 2 (two) times daily.  . Cholecalciferol (VITAMIN D3) 50 MCG (2000 UT) capsule Take 2,000 Units by mouth daily.  . dorzolamide-timolol (COSOPT) 22.3-6.8 MG/ML ophthalmic solution Place 1 drop into both eyes 2 (two) times daily.  Marland Kitchen lactulose (CHRONULAC) 10 GM/15ML solution Take 15 mLs (10 g total) by mouth 2 (two) times daily as needed for mild constipation.  Marland Kitchen latanoprost (XALATAN) 0.005 % ophthalmic solution Place 1 drop into both eyes at bedtime.  Marland Kitchen lisinopril (ZESTRIL) 20 MG tablet TAKE 1 TABLET BY MOUTH EVERY DAY  . pantoprazole (PROTONIX) 40 MG tablet Take 40 mg by mouth daily.  . rifaximin (XIFAXAN) 550 MG TABS tablet Take 1 tablet (550 mg total) by mouth 2 (two) times daily.  Marland Kitchen atorvastatin (LIPITOR) 20 MG tablet TAKE ONE (1) TABLET EACH DAY (Patient not taking: Reported on 12/23/2019)  . dicyclomine (BENTYL) 10 MG capsule Take 1 capsule (10 mg total) by mouth every 6 (six) hours as needed (abdominal pain). (Patient not taking: Reported on 12/23/2019)  . metoprolol (LOPRESSOR) 100 MG tablet TAKE ONE TABLET TWICE DAILY (Patient not taking: Reported on 11/27/2019)  . nitroGLYCERIN (NITROSTAT) 0.4 MG SL tablet 0.4 mg. (Patient not taking: Reported on 11/27/2019)  . traMADol-acetaminophen (ULTRACET) 37.5-325 MG tablet Take 1 tablet by mouth every 8 (eight) hours as needed.  (Patient not taking: Reported on 11/27/2019)  . triamcinolone lotion (KENALOG) 0.1 % Apply 1  application topically 2 (two) times daily. (Patient not taking: Reported on 11/27/2019)   No facility-administered medications prior to visit.    Review of Systems  Constitutional: Negative for appetite change, chills, fatigue and fever.  Respiratory: Negative for chest  tightness and shortness of breath.   Cardiovascular: Negative for chest pain and palpitations.  Gastrointestinal: Negative for abdominal pain, nausea and vomiting.  Neurological: Negative for dizziness and weakness.      Objective    There were no vitals taken for this visit.   Awake, alert, oriented x 3. In no apparent distress   Assessment & Plan     1. CAD in native artery She has been out of her statin probably for the last year and will get her restarted with refill  - atorvastatin (LIPITOR) 20 MG tablet; TAKE ONE (1) TABLET EACH DAY  Dispense: 90 tablet; Refill: 4 sent today.   - nitroGLYCERIN (NITROSTAT) 0.4 MG SL tablet; Place 1 tablet (0.4 mg total) under the tongue every 5 (five) minutes as needed for chest pain (go to ER if not resolved after 3 doses).  Dispense: 20 tablet; Refill: 1  2. Chronic low back pain without sciatica, unspecified back pain laterality She has been out of - traMADol-acetaminophen (ULTRACET) 37.5-325 MG tablet for some time and requested refill be sent it today.   3. Irritable bowel syndrome without diarrhea Has been out of dicyclomine for several months and requested refill today.    4. History of pulmonary embolism Previously taken Xarelto and warfarin, but has coagulopathy related to liver disease and now only taking ASA.   5. Cirrhosis of liver without ascites, unspecified hepatic cirrhosis type (HCC) Continue Xifaxin and Lactulose.    No follow-ups on file.    I discussed the assessment and treatment plan with the patient. The patient was provided an opportunity to ask questions and all were answered. The patient agreed with the plan and demonstrated an understanding of the instructions.   The patient was advised to call back or seek an in-person evaluation if the symptoms worsen or if the condition fails to improve as anticipated.  Will schedule follow up after she is discharged  I provided 14 minutes of non-face-to-face time during  this encounter.  The entirety of the information documented in the History of Present Illness, Review of Systems and Physical Exam were personally obtained by me. Portions of this information were initially documented by the CMA and reviewed by me for thoroughness and accuracy.     Lelon Huh, MD [facility] [phone] (phone) [phone] (fax)  [place]

## 2019-12-24 NOTE — Telephone Encounter (Signed)
I spoke with Lida from [facility] confirming her question was in regards to the FMLA forms that were signed on 11/20/2019. Lida stated that they needed an end date for question # 9 because continuous is not excepted per their requirements for FMLA. Lida stated that she received a faxed from pt's daughter in law on 12/18/2019 having an end date of 05/19/2020 but the fax was blurry. Lida is wanting to confirm that Dr. Caryn Section made the addendum to the FMLA forms for # 9 and that 05/19/2020 is the correct end date. Please advise. Thanks TNP

## 2019-12-25 DIAGNOSIS — R4189 Other symptoms and signs involving cognitive functions and awareness: Secondary | ICD-10-CM | POA: Diagnosis not present

## 2019-12-25 DIAGNOSIS — Z955 Presence of coronary angioplasty implant and graft: Secondary | ICD-10-CM | POA: Diagnosis not present

## 2019-12-25 DIAGNOSIS — I5042 Chronic combined systolic (congestive) and diastolic (congestive) heart failure: Secondary | ICD-10-CM | POA: Diagnosis not present

## 2019-12-25 DIAGNOSIS — R1013 Epigastric pain: Secondary | ICD-10-CM | POA: Diagnosis not present

## 2019-12-25 DIAGNOSIS — Z7982 Long term (current) use of aspirin: Secondary | ICD-10-CM | POA: Diagnosis not present

## 2019-12-25 DIAGNOSIS — D696 Thrombocytopenia, unspecified: Secondary | ICD-10-CM | POA: Diagnosis not present

## 2019-12-25 DIAGNOSIS — I712 Thoracic aortic aneurysm, without rupture: Secondary | ICD-10-CM | POA: Diagnosis not present

## 2019-12-25 DIAGNOSIS — K7460 Unspecified cirrhosis of liver: Secondary | ICD-10-CM | POA: Diagnosis not present

## 2019-12-25 DIAGNOSIS — M810 Age-related osteoporosis without current pathological fracture: Secondary | ICD-10-CM | POA: Diagnosis not present

## 2019-12-25 DIAGNOSIS — I25810 Atherosclerosis of coronary artery bypass graft(s) without angina pectoris: Secondary | ICD-10-CM | POA: Diagnosis not present

## 2019-12-25 DIAGNOSIS — E1122 Type 2 diabetes mellitus with diabetic chronic kidney disease: Secondary | ICD-10-CM | POA: Diagnosis not present

## 2019-12-25 DIAGNOSIS — I252 Old myocardial infarction: Secondary | ICD-10-CM | POA: Diagnosis not present

## 2019-12-25 DIAGNOSIS — N183 Chronic kidney disease, stage 3 unspecified: Secondary | ICD-10-CM | POA: Diagnosis not present

## 2019-12-25 DIAGNOSIS — F0390 Unspecified dementia without behavioral disturbance: Secondary | ICD-10-CM | POA: Diagnosis not present

## 2019-12-25 DIAGNOSIS — H409 Unspecified glaucoma: Secondary | ICD-10-CM | POA: Diagnosis not present

## 2019-12-25 DIAGNOSIS — I130 Hypertensive heart and chronic kidney disease with heart failure and stage 1 through stage 4 chronic kidney disease, or unspecified chronic kidney disease: Secondary | ICD-10-CM | POA: Diagnosis not present

## 2019-12-25 DIAGNOSIS — Z86718 Personal history of other venous thrombosis and embolism: Secondary | ICD-10-CM | POA: Diagnosis not present

## 2019-12-25 DIAGNOSIS — I2510 Atherosclerotic heart disease of native coronary artery without angina pectoris: Secondary | ICD-10-CM | POA: Diagnosis not present

## 2019-12-25 DIAGNOSIS — Z86711 Personal history of pulmonary embolism: Secondary | ICD-10-CM | POA: Diagnosis not present

## 2019-12-25 DIAGNOSIS — E785 Hyperlipidemia, unspecified: Secondary | ICD-10-CM | POA: Diagnosis not present

## 2019-12-26 ENCOUNTER — Telehealth: Payer: Self-pay

## 2019-12-26 NOTE — Telephone Encounter (Signed)
I called patient's daughter in- law Angela Nevin and advised her of this. She verbalized understanding. Appointment scheduled for tomorrow at 9:40am with Dr. Caryn Section.

## 2019-12-26 NOTE — Telephone Encounter (Signed)
Should be OK to schedule tomorrow unless she exhibits a return of symptoms. If that happens, should go to ER to rule out impending stroke.

## 2019-12-26 NOTE — Telephone Encounter (Signed)
Patient's son Marchia Bond came up to the office requesting to schedule an in office visit with Dr. Caryn Section. He had his wife Glendell Docker on a face time on his phone during the conversation. Angela Nevin explained to me that they tried calling our office to schedule an appointment, but the people answering the phone told her that they would need to speak with a nurse before they could schedule an appointment. Angela Nevin states that last night Ms. Bruntz's speech seems slightly slurred. This happened in the middle of the night and patient was half asleep. This morning patient is doing fine. They haven't noticed any slurred speech. Patient is alert and oriented. She is eating and talking just fine. I spoke with patient via face time on her sons phone and she was able to competently answer my questions. Her face appeared symmetric on both sides. Patient was sitting upright at the kitchen table eating breakfast.  Angela Nevin informed me that patient has had some lower extremity weakness for the past several weeks and she is not able to stand.  Dr. Caryn Section has an appointment slot open for tomorrow at 3:40pm, but its a same day slot. Is it ok to schedule patient in that slot? Or should she be seen sooner by someone else? Or any other recommendations?

## 2019-12-27 ENCOUNTER — Other Ambulatory Visit: Payer: Self-pay

## 2019-12-27 ENCOUNTER — Telehealth: Payer: Self-pay

## 2019-12-27 ENCOUNTER — Encounter: Payer: Self-pay | Admitting: Family Medicine

## 2019-12-27 ENCOUNTER — Ambulatory Visit (INDEPENDENT_AMBULATORY_CARE_PROVIDER_SITE_OTHER): Payer: Medicare Other | Admitting: Family Medicine

## 2019-12-27 VITALS — BP 88/47 | HR 74 | Temp 98.0°F | Resp 16

## 2019-12-27 DIAGNOSIS — R4781 Slurred speech: Secondary | ICD-10-CM | POA: Diagnosis not present

## 2019-12-27 DIAGNOSIS — N183 Chronic kidney disease, stage 3 unspecified: Secondary | ICD-10-CM | POA: Diagnosis not present

## 2019-12-27 DIAGNOSIS — K6289 Other specified diseases of anus and rectum: Secondary | ICD-10-CM

## 2019-12-27 DIAGNOSIS — E1122 Type 2 diabetes mellitus with diabetic chronic kidney disease: Secondary | ICD-10-CM | POA: Diagnosis not present

## 2019-12-27 DIAGNOSIS — M25551 Pain in right hip: Secondary | ICD-10-CM | POA: Diagnosis not present

## 2019-12-27 DIAGNOSIS — D696 Thrombocytopenia, unspecified: Secondary | ICD-10-CM | POA: Diagnosis not present

## 2019-12-27 DIAGNOSIS — N2889 Other specified disorders of kidney and ureter: Secondary | ICD-10-CM

## 2019-12-27 DIAGNOSIS — R531 Weakness: Secondary | ICD-10-CM

## 2019-12-27 DIAGNOSIS — I712 Thoracic aortic aneurysm, without rupture: Secondary | ICD-10-CM | POA: Diagnosis not present

## 2019-12-27 DIAGNOSIS — I2510 Atherosclerotic heart disease of native coronary artery without angina pectoris: Secondary | ICD-10-CM | POA: Diagnosis not present

## 2019-12-27 DIAGNOSIS — R109 Unspecified abdominal pain: Secondary | ICD-10-CM

## 2019-12-27 DIAGNOSIS — R55 Syncope and collapse: Secondary | ICD-10-CM | POA: Diagnosis not present

## 2019-12-27 DIAGNOSIS — I10 Essential (primary) hypertension: Secondary | ICD-10-CM

## 2019-12-27 DIAGNOSIS — I130 Hypertensive heart and chronic kidney disease with heart failure and stage 1 through stage 4 chronic kidney disease, or unspecified chronic kidney disease: Secondary | ICD-10-CM | POA: Diagnosis not present

## 2019-12-27 DIAGNOSIS — I5042 Chronic combined systolic (congestive) and diastolic (congestive) heart failure: Secondary | ICD-10-CM | POA: Diagnosis not present

## 2019-12-27 MED ORDER — HYDROCODONE-ACETAMINOPHEN 7.5-325 MG PO TABS: 1.0000 | ORAL_TABLET | Freq: Four times a day (QID) | ORAL | 0 refills | Status: AC | PRN

## 2019-12-27 MED ORDER — PRAMOXINE-HC 1-1 % EX CREA: TOPICAL_CREAM | Freq: Three times a day (TID) | CUTANEOUS | 0 refills | Status: AC

## 2019-12-27 MED ORDER — AMLODIPINE BESYLATE 5 MG PO TABS: 2.5000 mg | ORAL_TABLET | Freq: Every day | ORAL | 0 refills | Status: AC

## 2019-12-27 NOTE — Patient Instructions (Signed)
.   Reduce amlodipine to 1/2 tablet every day   . Please bring all of your medications to every appointment so we can make sure that our medication list is the same as yours.

## 2019-12-27 NOTE — Telephone Encounter (Signed)
Copied from [place] [id number]. Topic: General - Other >> Dec 27, 2019 11:46 AM Keene Breath wrote: Reason for CRM: Patient's POA Angela Nevin) called to request a handicap sticker.  Please call to discuss at [phone]

## 2019-12-27 NOTE — Telephone Encounter (Signed)
Verbal confirmation needed, please advise. Lida is requesting a call back as soon as possible  Best contact: [phone]  VM available

## 2019-12-27 NOTE — Progress Notes (Signed)
Established patient visit   Patient: Terri Bird   DOB: 1933-02-07   86 y.o. Female  MRN: [id number] Visit Date: 12/27/2019  Today's healthcare provider: Lelon Huh, MD   Chief Complaint  Patient presents with  . Extremity Weakness   Subjective    Extremity Weakness  Pain location: both legs. This is a new problem. The current episode started more than 1 month ago. There has been no history of extremity trauma. The problem occurs constantly. The problem has been unchanged. Pertinent negatives include no fever.    She has had fall in August hurting her right pain since then. Her family members reports that since moving in with them she has two episodes of becoming unresponsive to verbal stimuli for several minutes, but not losing consciousness. The most recent episode was 12-21-2019 when she was admitted to [facility]. Non contrast abdominal CT revealed Numerous bilateral renal cysts and exophytic masses suspicious for RCC.  She had unremarkable echo and carotid ultrasound. She was mildly hypotensive and felt to be dehydrated and given IVF. She returned to her baseline by the discharge. Her family reports some days she is communicative and others she is minimally responsive. She has indicated she is having some rectal pain and apparently has trouble with hemorrhoids. They report she has been eating better since discharge. She remains very weak and is not able to stand without assistance and cannot transfer from her wheelchair to a regular bed. They are requesting hospital bed.      Medications: Outpatient Medications Prior to Visit  Medication Sig  . amLODipine (NORVASC) 5 MG tablet TAKE 1 TABLET BY MOUTH EVERY DAY  . aspirin 81 MG tablet Take 81 mg by mouth daily.  Marland Kitchen atorvastatin (LIPITOR) 20 MG tablet TAKE ONE (1) TABLET EACH DAY  . brimonidine (ALPHAGAN) 0.2 % ophthalmic solution Apply 1 drop to eye 2 (two) times daily.  . Cholecalciferol (VITAMIN D3) 50 MCG (2000  UT) capsule Take 2,000 Units by mouth daily.  Marland Kitchen dicyclomine (BENTYL) 10 MG capsule Take 1 capsule (10 mg total) by mouth every 6 (six) hours as needed (abdominal pain).  . dorzolamide-timolol (COSOPT) 22.3-6.8 MG/ML ophthalmic solution Place 1 drop into both eyes 2 (two) times daily.  Marland Kitchen lactulose (CHRONULAC) 10 GM/15ML solution Take 15 mLs (10 g total) by mouth 2 (two) times daily as needed for mild constipation.  Marland Kitchen latanoprost (XALATAN) 0.005 % ophthalmic solution Place 1 drop into both eyes at bedtime.  . nitroGLYCERIN (NITROSTAT) 0.4 MG SL tablet Place 1 tablet (0.4 mg total) under the tongue every 5 (five) minutes as needed for chest pain (go to ER if not resolved after 3 doses).  . pantoprazole (PROTONIX) 40 MG tablet Take 40 mg by mouth daily.  . rifaximin (XIFAXAN) 550 MG TABS tablet Take 1 tablet (550 mg total) by mouth 2 (two) times daily.  . traMADol-acetaminophen (ULTRACET) 37.5-325 MG tablet Take 1 tablet by mouth every 8 (eight) hours as needed for moderate pain.  . [DISCONTINUED] lisinopril (ZESTRIL) 20 MG tablet TAKE 1 TABLET BY MOUTH EVERY DAY (Patient not taking: Reported on 12/27/2019)   No facility-administered medications prior to visit.    Review of Systems  Constitutional: Positive for fatigue. Negative for appetite change, chills and fever.  Respiratory: Negative for chest tightness and shortness of breath.   Cardiovascular: Negative for chest pain and palpitations.  Gastrointestinal: Negative for abdominal pain, nausea and vomiting.       Rectal pain  and anal lesion  Musculoskeletal: Positive for arthralgias, extremity weakness and myalgias.  Neurological: Positive for weakness (in legs). Negative for dizziness.  Psychiatric/Behavioral:       Forgets names of relatives      Objective    BP (!) 88/47 (BP Location: Left Arm, Patient Position: Sitting, Cuff Size: Normal)   Pulse 74   Temp 98 F (36.7 C) (Oral)   Resp 16   SpO2 99% Comment: room air   Physical  Exam  Awake and alert. Nods head and gazes around in response to question, but is not verbalizing today.  Chest CTA, abdomen is slightly tender diffusely. No discrete masses.  Heart RRR without murmurs.   No results found for any visits on 12/27/19.  Assessment & Plan     1. Slurred speech  - CT Head Wo Contrast; Future  2. Weakness  - Ambulatory referral to Neurology - CT Head Wo Contrast; Future  3. Renal mass Concerning for RCC. Need to rule out brain mets, obtain head CT as above.   4. Thrombocytopenia ([place]) Secondary to chronic liver disease.   5. Abdominal pain, unspecified abdominal location No relief from tramadol. rx- HYDROcodone-acetaminophen (NORCO) 7.5-325 MG tablet; Take 1 tablet by mouth every 6 (six) hours as needed for up to 5 days for moderate pain.  Dispense: 20 tablet; Refill: 0  6. Rectal pain Family reports likely hemorrhoids. - pramoxine-hydrocortisone (ANALPRAM HC) cream; Apply topically 3 (three) times daily.  Dispense: 30 g; Refill: 0  7. Syncope, unspecified syncope type  - Ambulatory referral to Neurology - CT Head Wo Contrast; Future  8. Essential hypertension Now hypotensive.   Off lisinopril, reduce amlodipine to - amLODipine (NORVASC) 5 MG tablet; Take 0.5 tablets (2.5 mg total) by mouth daily.  Dispense: 3 tablet; Refill: 0  9. Acute right hip pain  - HYDROcodone-acetaminophen (NORCO) 7.5-325 MG tablet; Take 1 tablet by mouth every 6 (six) hours as needed for up to 5 days for moderate pain.  Dispense: 20 tablet; Refill: 0   She is unable to transfer to regular bed and requires positional changes not available in regular bed. Will order hospital bed.         The entirety of the information documented in the History of Present Illness, Review of Systems and Physical Exam were personally obtained by me. Portions of this information were initially documented by the CMA and reviewed by me for thoroughness and accuracy.    Addressed  extensive list of chronic and acute medical problems today requiring 45 minutes reviewing her medical record, counseling patient regarding her conditions and coordination of care.      Lelon Huh, MD  [facility] [phone] (phone) [phone] (fax)  [place]

## 2019-12-28 DIAGNOSIS — Z20822 Contact with and (suspected) exposure to covid-19: Secondary | ICD-10-CM | POA: Diagnosis not present

## 2019-12-28 DIAGNOSIS — I959 Hypotension, unspecified: Secondary | ICD-10-CM | POA: Diagnosis not present

## 2019-12-28 DIAGNOSIS — N183 Chronic kidney disease, stage 3 unspecified: Secondary | ICD-10-CM | POA: Diagnosis not present

## 2019-12-28 DIAGNOSIS — R531 Weakness: Secondary | ICD-10-CM | POA: Diagnosis not present

## 2019-12-28 DIAGNOSIS — I82431 Acute embolism and thrombosis of right popliteal vein: Secondary | ICD-10-CM | POA: Diagnosis not present

## 2019-12-28 DIAGNOSIS — R0989 Other specified symptoms and signs involving the circulatory and respiratory systems: Secondary | ICD-10-CM | POA: Diagnosis not present

## 2019-12-28 DIAGNOSIS — F05 Delirium due to known physiological condition: Secondary | ICD-10-CM | POA: Diagnosis not present

## 2019-12-28 DIAGNOSIS — R748 Abnormal levels of other serum enzymes: Secondary | ICD-10-CM | POA: Diagnosis not present

## 2019-12-28 DIAGNOSIS — R7989 Other specified abnormal findings of blood chemistry: Secondary | ICD-10-CM | POA: Diagnosis not present

## 2019-12-28 DIAGNOSIS — R4182 Altered mental status, unspecified: Secondary | ICD-10-CM | POA: Diagnosis not present

## 2019-12-28 DIAGNOSIS — D696 Thrombocytopenia, unspecified: Secondary | ICD-10-CM | POA: Diagnosis not present

## 2019-12-28 DIAGNOSIS — E1122 Type 2 diabetes mellitus with diabetic chronic kidney disease: Secondary | ICD-10-CM | POA: Diagnosis not present

## 2019-12-29 DIAGNOSIS — E860 Dehydration: Secondary | ICD-10-CM | POA: Diagnosis not present

## 2019-12-29 DIAGNOSIS — Z043 Encounter for examination and observation following other accident: Secondary | ICD-10-CM | POA: Diagnosis not present

## 2019-12-29 DIAGNOSIS — R531 Weakness: Secondary | ICD-10-CM | POA: Diagnosis not present

## 2019-12-29 DIAGNOSIS — Z20822 Contact with and (suspected) exposure to covid-19: Secondary | ICD-10-CM | POA: Diagnosis not present

## 2019-12-29 DIAGNOSIS — M8588 Other specified disorders of bone density and structure, other site: Secondary | ICD-10-CM | POA: Diagnosis not present

## 2019-12-29 DIAGNOSIS — R748 Abnormal levels of other serum enzymes: Secondary | ICD-10-CM | POA: Diagnosis not present

## 2019-12-29 DIAGNOSIS — I959 Hypotension, unspecified: Secondary | ICD-10-CM | POA: Diagnosis not present

## 2019-12-30 DIAGNOSIS — R7989 Other specified abnormal findings of blood chemistry: Secondary | ICD-10-CM | POA: Diagnosis not present

## 2019-12-30 DIAGNOSIS — I672 Cerebral atherosclerosis: Secondary | ICD-10-CM | POA: Diagnosis not present

## 2019-12-30 DIAGNOSIS — I959 Hypotension, unspecified: Secondary | ICD-10-CM | POA: Diagnosis not present

## 2019-12-30 DIAGNOSIS — I10 Essential (primary) hypertension: Secondary | ICD-10-CM | POA: Diagnosis not present

## 2019-12-30 DIAGNOSIS — R55 Syncope and collapse: Secondary | ICD-10-CM | POA: Diagnosis not present

## 2019-12-30 DIAGNOSIS — I6782 Cerebral ischemia: Secondary | ICD-10-CM | POA: Diagnosis not present

## 2019-12-30 DIAGNOSIS — R4781 Slurred speech: Secondary | ICD-10-CM | POA: Diagnosis not present

## 2019-12-30 NOTE — Telephone Encounter (Signed)
Need more info

## 2019-12-30 NOTE — Telephone Encounter (Signed)
Patient's daughter Terri Bird states patient is unable to walk and having episodes of hypotension. Patient is at the ER at this time.

## 2019-12-31 DIAGNOSIS — I2510 Atherosclerotic heart disease of native coronary artery without angina pectoris: Secondary | ICD-10-CM | POA: Diagnosis present

## 2019-12-31 DIAGNOSIS — Z955 Presence of coronary angioplasty implant and graft: Secondary | ICD-10-CM | POA: Diagnosis not present

## 2019-12-31 DIAGNOSIS — E440 Moderate protein-calorie malnutrition: Secondary | ICD-10-CM | POA: Diagnosis not present

## 2019-12-31 DIAGNOSIS — I712 Thoracic aortic aneurysm, without rupture: Secondary | ICD-10-CM | POA: Diagnosis not present

## 2019-12-31 DIAGNOSIS — E1122 Type 2 diabetes mellitus with diabetic chronic kidney disease: Secondary | ICD-10-CM | POA: Diagnosis present

## 2019-12-31 DIAGNOSIS — F0391 Unspecified dementia with behavioral disturbance: Secondary | ICD-10-CM | POA: Diagnosis not present

## 2019-12-31 DIAGNOSIS — I130 Hypertensive heart and chronic kidney disease with heart failure and stage 1 through stage 4 chronic kidney disease, or unspecified chronic kidney disease: Secondary | ICD-10-CM | POA: Diagnosis not present

## 2019-12-31 DIAGNOSIS — R1013 Epigastric pain: Secondary | ICD-10-CM | POA: Diagnosis not present

## 2019-12-31 DIAGNOSIS — I82431 Acute embolism and thrombosis of right popliteal vein: Secondary | ICD-10-CM | POA: Diagnosis present

## 2019-12-31 DIAGNOSIS — Z8673 Personal history of transient ischemic attack (TIA), and cerebral infarction without residual deficits: Secondary | ICD-10-CM | POA: Diagnosis not present

## 2019-12-31 DIAGNOSIS — R7989 Other specified abnormal findings of blood chemistry: Secondary | ICD-10-CM | POA: Diagnosis present

## 2019-12-31 DIAGNOSIS — I252 Old myocardial infarction: Secondary | ICD-10-CM | POA: Diagnosis not present

## 2019-12-31 DIAGNOSIS — D696 Thrombocytopenia, unspecified: Secondary | ICD-10-CM | POA: Diagnosis present

## 2019-12-31 DIAGNOSIS — I5042 Chronic combined systolic (congestive) and diastolic (congestive) heart failure: Secondary | ICD-10-CM | POA: Diagnosis not present

## 2019-12-31 DIAGNOSIS — F05 Delirium due to known physiological condition: Secondary | ICD-10-CM | POA: Diagnosis present

## 2019-12-31 DIAGNOSIS — R4189 Other symptoms and signs involving cognitive functions and awareness: Secondary | ICD-10-CM | POA: Diagnosis not present

## 2019-12-31 DIAGNOSIS — R531 Weakness: Secondary | ICD-10-CM | POA: Diagnosis not present

## 2019-12-31 DIAGNOSIS — N183 Chronic kidney disease, stage 3 unspecified: Secondary | ICD-10-CM | POA: Diagnosis present

## 2019-12-31 DIAGNOSIS — Z86718 Personal history of other venous thrombosis and embolism: Secondary | ICD-10-CM | POA: Diagnosis not present

## 2019-12-31 DIAGNOSIS — M810 Age-related osteoporosis without current pathological fracture: Secondary | ICD-10-CM | POA: Diagnosis present

## 2019-12-31 DIAGNOSIS — I129 Hypertensive chronic kidney disease with stage 1 through stage 4 chronic kidney disease, or unspecified chronic kidney disease: Secondary | ICD-10-CM | POA: Diagnosis present

## 2019-12-31 DIAGNOSIS — E785 Hyperlipidemia, unspecified: Secondary | ICD-10-CM | POA: Diagnosis present

## 2019-12-31 DIAGNOSIS — Z7982 Long term (current) use of aspirin: Secondary | ICD-10-CM | POA: Diagnosis not present

## 2019-12-31 DIAGNOSIS — N63 Unspecified lump in unspecified breast: Secondary | ICD-10-CM | POA: Diagnosis present

## 2019-12-31 DIAGNOSIS — Z66 Do not resuscitate: Secondary | ICD-10-CM | POA: Diagnosis not present

## 2019-12-31 DIAGNOSIS — M6250 Muscle wasting and atrophy, not elsewhere classified, unspecified site: Secondary | ICD-10-CM | POA: Diagnosis not present

## 2019-12-31 DIAGNOSIS — G309 Alzheimer's disease, unspecified: Secondary | ICD-10-CM | POA: Diagnosis not present

## 2019-12-31 DIAGNOSIS — F0280 Dementia in other diseases classified elsewhere without behavioral disturbance: Secondary | ICD-10-CM | POA: Diagnosis not present

## 2019-12-31 DIAGNOSIS — Z86711 Personal history of pulmonary embolism: Secondary | ICD-10-CM | POA: Diagnosis not present

## 2019-12-31 DIAGNOSIS — Z9049 Acquired absence of other specified parts of digestive tract: Secondary | ICD-10-CM | POA: Diagnosis not present

## 2019-12-31 DIAGNOSIS — Z7401 Bed confinement status: Secondary | ICD-10-CM | POA: Diagnosis not present

## 2019-12-31 DIAGNOSIS — Z515 Encounter for palliative care: Secondary | ICD-10-CM | POA: Diagnosis not present

## 2019-12-31 DIAGNOSIS — I824Y1 Acute embolism and thrombosis of unspecified deep veins of right proximal lower extremity: Secondary | ICD-10-CM | POA: Diagnosis not present

## 2019-12-31 DIAGNOSIS — H409 Unspecified glaucoma: Secondary | ICD-10-CM | POA: Diagnosis not present

## 2019-12-31 DIAGNOSIS — Z20822 Contact with and (suspected) exposure to covid-19: Secondary | ICD-10-CM | POA: Diagnosis present

## 2019-12-31 DIAGNOSIS — I959 Hypotension, unspecified: Secondary | ICD-10-CM | POA: Diagnosis present

## 2019-12-31 DIAGNOSIS — K7460 Unspecified cirrhosis of liver: Secondary | ICD-10-CM | POA: Diagnosis present

## 2019-12-31 DIAGNOSIS — Z95828 Presence of other vascular implants and grafts: Secondary | ICD-10-CM | POA: Diagnosis not present

## 2020-01-01 ENCOUNTER — Encounter (INDEPENDENT_AMBULATORY_CARE_PROVIDER_SITE_OTHER): Payer: Medicare Other | Admitting: Family Medicine

## 2020-01-01 DIAGNOSIS — I5042 Chronic combined systolic (congestive) and diastolic (congestive) heart failure: Secondary | ICD-10-CM | POA: Diagnosis not present

## 2020-01-01 DIAGNOSIS — K7460 Unspecified cirrhosis of liver: Secondary | ICD-10-CM | POA: Diagnosis not present

## 2020-01-01 DIAGNOSIS — E1122 Type 2 diabetes mellitus with diabetic chronic kidney disease: Secondary | ICD-10-CM

## 2020-01-01 DIAGNOSIS — R4189 Other symptoms and signs involving cognitive functions and awareness: Secondary | ICD-10-CM

## 2020-01-01 DIAGNOSIS — H409 Unspecified glaucoma: Secondary | ICD-10-CM | POA: Diagnosis not present

## 2020-01-01 DIAGNOSIS — R1013 Epigastric pain: Secondary | ICD-10-CM

## 2020-01-01 DIAGNOSIS — N183 Chronic kidney disease, stage 3 unspecified: Secondary | ICD-10-CM

## 2020-01-01 DIAGNOSIS — I2510 Atherosclerotic heart disease of native coronary artery without angina pectoris: Secondary | ICD-10-CM

## 2020-01-01 DIAGNOSIS — M810 Age-related osteoporosis without current pathological fracture: Secondary | ICD-10-CM

## 2020-01-01 DIAGNOSIS — I130 Hypertensive heart and chronic kidney disease with heart failure and stage 1 through stage 4 chronic kidney disease, or unspecified chronic kidney disease: Secondary | ICD-10-CM | POA: Diagnosis not present

## 2020-01-01 DIAGNOSIS — R531 Weakness: Secondary | ICD-10-CM | POA: Diagnosis not present

## 2020-01-01 DIAGNOSIS — Z7401 Bed confinement status: Secondary | ICD-10-CM | POA: Diagnosis not present

## 2020-01-01 DIAGNOSIS — D696 Thrombocytopenia, unspecified: Secondary | ICD-10-CM | POA: Diagnosis not present

## 2020-01-01 DIAGNOSIS — M6250 Muscle wasting and atrophy, not elsewhere classified, unspecified site: Secondary | ICD-10-CM | POA: Diagnosis not present

## 2020-01-01 DIAGNOSIS — I712 Thoracic aortic aneurysm, without rupture: Secondary | ICD-10-CM

## 2020-01-02 ENCOUNTER — Other Ambulatory Visit: Payer: Self-pay

## 2020-01-06 ENCOUNTER — Encounter (INDEPENDENT_AMBULATORY_CARE_PROVIDER_SITE_OTHER): Payer: Medicare Other | Admitting: Family Medicine

## 2020-01-06 ENCOUNTER — Other Ambulatory Visit: Payer: Self-pay

## 2020-01-06 DIAGNOSIS — G309 Alzheimer's disease, unspecified: Secondary | ICD-10-CM

## 2020-01-06 DIAGNOSIS — F0280 Dementia in other diseases classified elsewhere without behavioral disturbance: Secondary | ICD-10-CM | POA: Diagnosis not present

## 2020-01-06 DIAGNOSIS — I959 Hypotension, unspecified: Secondary | ICD-10-CM | POA: Diagnosis not present

## 2020-01-06 DIAGNOSIS — N183 Chronic kidney disease, stage 3 unspecified: Secondary | ICD-10-CM

## 2020-01-06 DIAGNOSIS — Z86711 Personal history of pulmonary embolism: Secondary | ICD-10-CM

## 2020-01-06 DIAGNOSIS — E440 Moderate protein-calorie malnutrition: Secondary | ICD-10-CM

## 2020-01-06 DIAGNOSIS — Z515 Encounter for palliative care: Secondary | ICD-10-CM

## 2020-01-06 DIAGNOSIS — K7460 Unspecified cirrhosis of liver: Secondary | ICD-10-CM

## 2020-01-20 DEATH — deceased

## 2020-05-05 ENCOUNTER — Ambulatory Visit: Payer: Self-pay | Admitting: Urology
